# Patient Record
Sex: Male | Born: 1980 | Race: White | Hispanic: No | Marital: Single | Smoking: Never smoker
Health system: Southern US, Community
[De-identification: ages and names within clinical notes are randomized; demographics above are authoritative.]

## PROBLEM LIST (undated history)

## (undated) DIAGNOSIS — D171 Benign lipomatous neoplasm of skin and subcutaneous tissue of trunk: Secondary | ICD-10-CM

## (undated) DIAGNOSIS — Z86018 Personal history of other benign neoplasm: Secondary | ICD-10-CM

## (undated) DIAGNOSIS — N509 Disorder of male genital organs, unspecified: Principal | ICD-10-CM

## (undated) DIAGNOSIS — Z79899 Other long term (current) drug therapy: Secondary | ICD-10-CM

## (undated) DIAGNOSIS — M47816 Spondylosis without myelopathy or radiculopathy, lumbar region: Secondary | ICD-10-CM

## (undated) HISTORY — DX: Personal history of other benign neoplasm: Z86.018

## (undated) HISTORY — DX: Benign lipomatous neoplasm of skin and subcutaneous tissue of trunk: D17.1

## (undated) HISTORY — DX: Disorder of male genital organs, unspecified: N50.9

## (undated) HISTORY — DX: Other long term (current) drug therapy: Z79.899

## (undated) HISTORY — DX: Spondylosis without myelopathy or radiculopathy, lumbar region: M47.816

## (undated) HISTORY — PX: OTHER SURGICAL HISTORY: SHX169

---

## 2014-04-19 ENCOUNTER — Ambulatory Visit: Payer: Self-pay | Admitting: Internal Medicine

## 2014-10-02 IMAGING — CR DG LUMBAR SPINE COMPLETE 4+V
6 series · 6 of 6 positions shown · non-contrast
Comparison: None.

CLINICAL DATA: Chronic low back and sacral issues predominantly at
night with increased symptoms more recently

EXAM:
LUMBAR SPINE - COMPLETE 4+ VIEW

[l-spine ap]
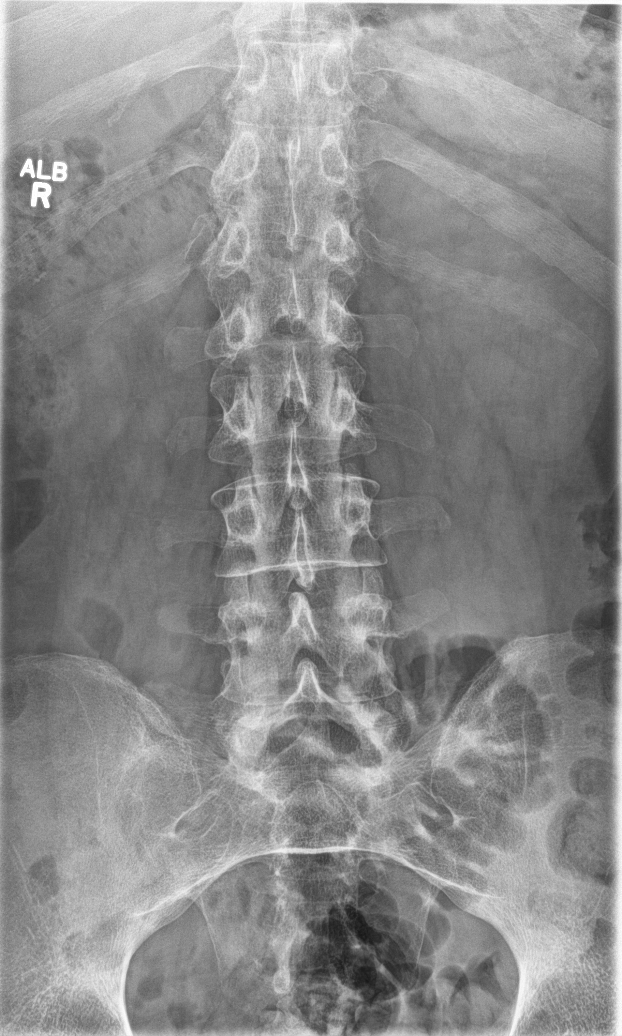

[l-spine obl (1 of 2)]
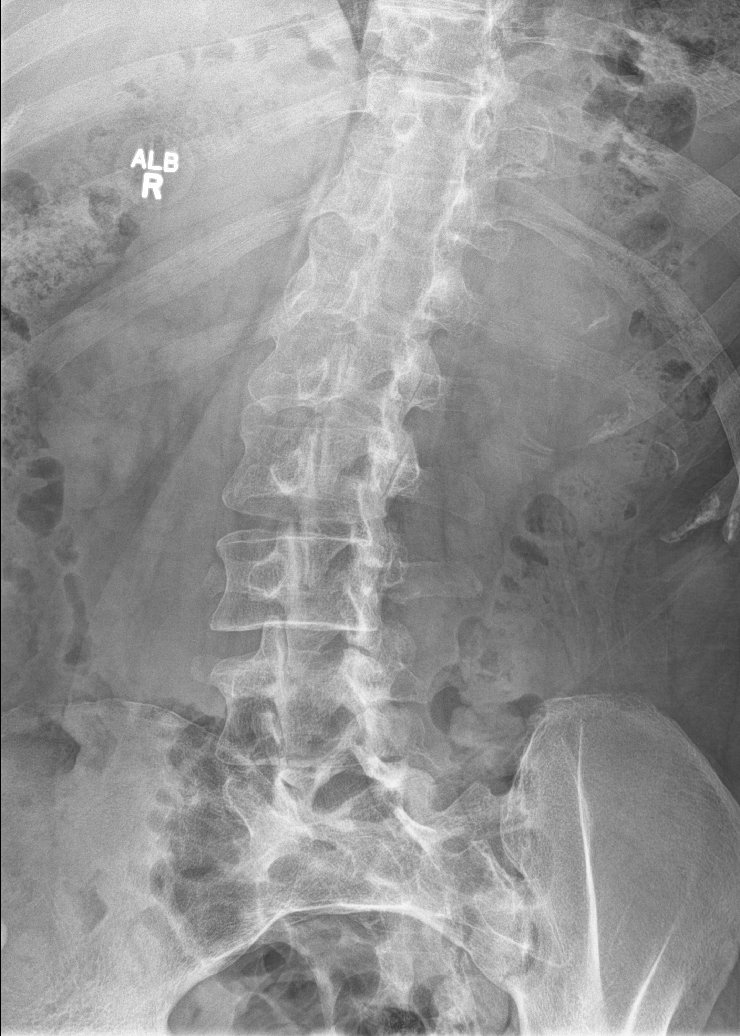

[l-spine obl (2 of 2)]
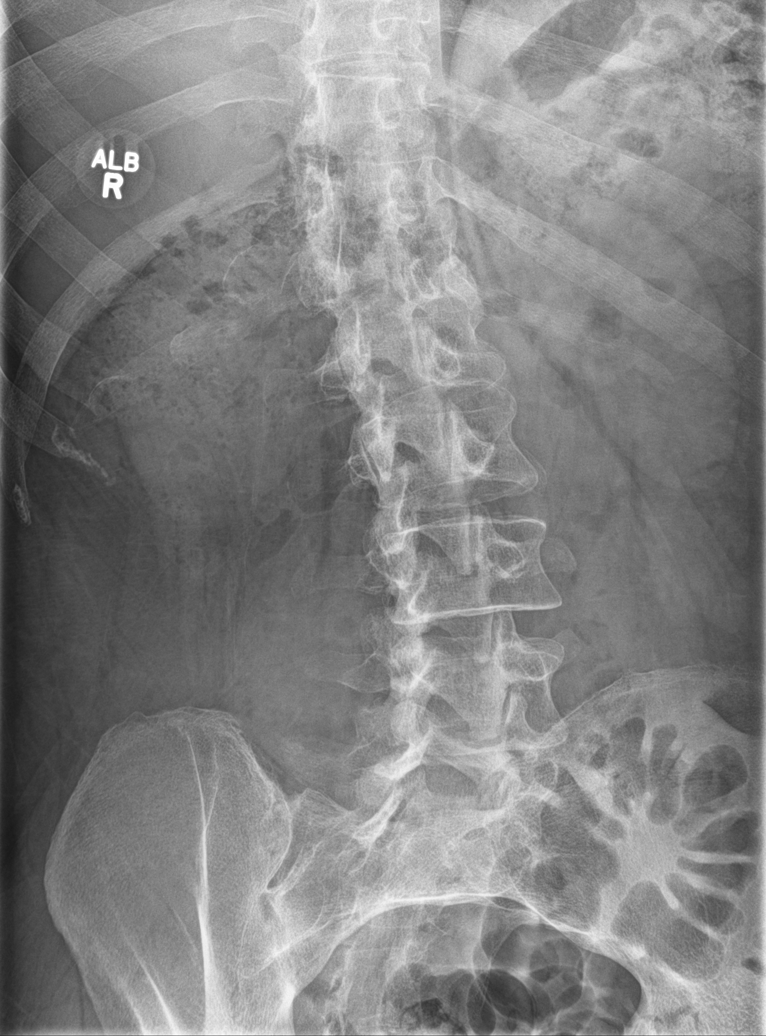

[l-spine lat]
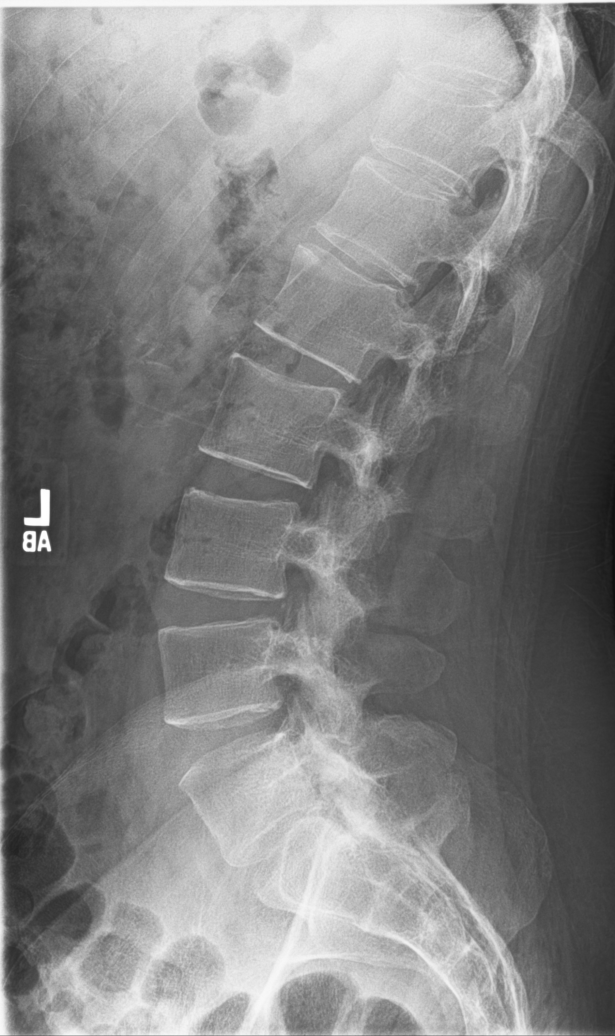

[l-spine spot (1 of 2)]
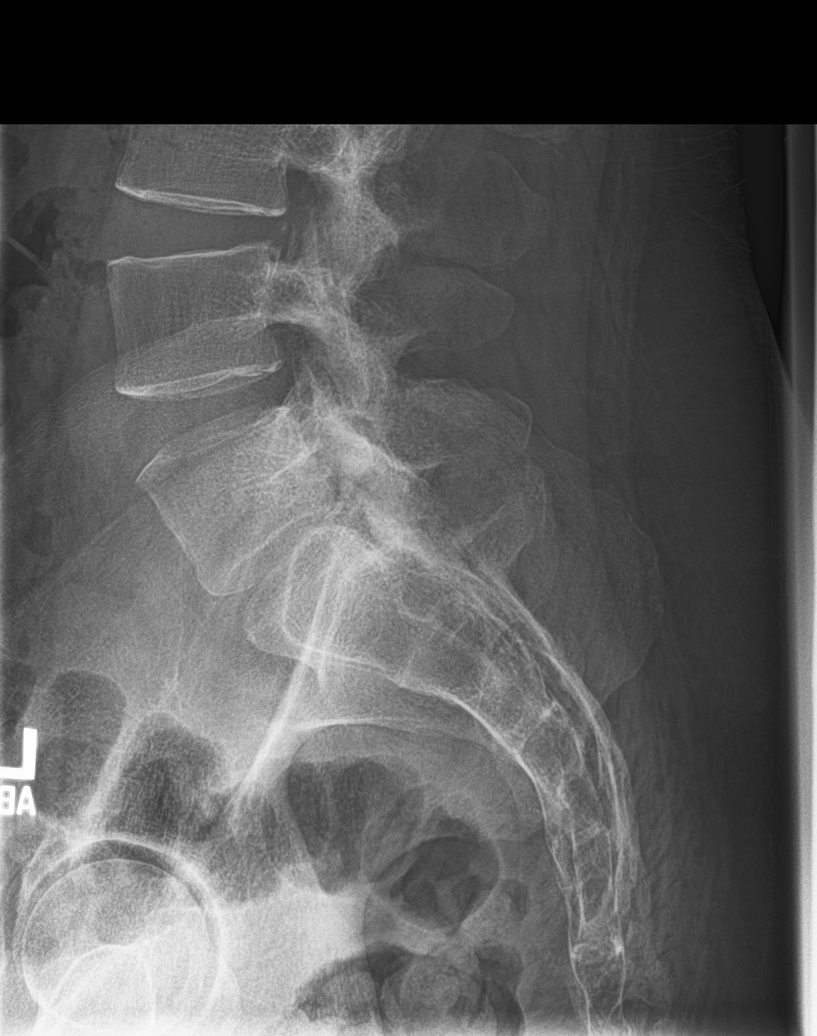

[l-spine spot (2 of 2)]
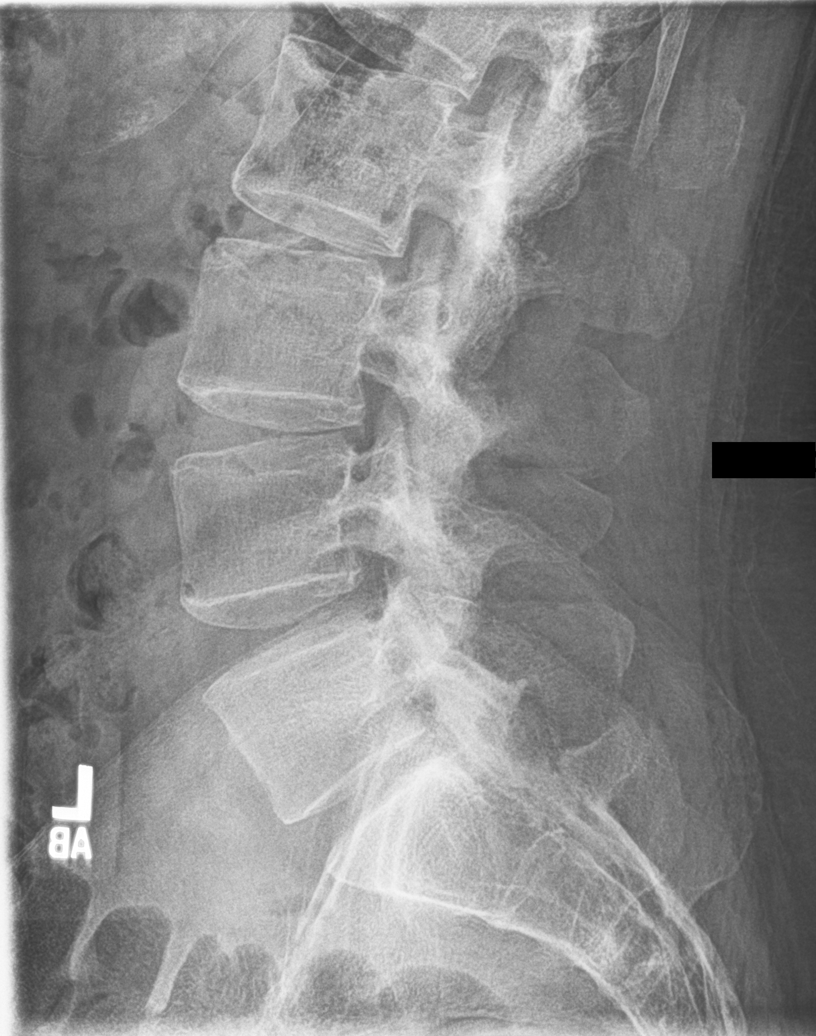

[6 of 6 positions shown; findings below may reference images not displayed]

FINDINGS: The lumbar vertebral bodies are preserved in height. The
intervertebral disc space heights are well maintained. There is mild
facet joint hypertrophy at L5-S1. The sacrum and SI joints are
grossly normal where visualized.
IMPRESSION: There is no acute or significant chronic bony abnormality of the
lumbar spine. Given the chronic nature of the symptoms, MRI may be
useful.

## 2014-11-17 LAB — BASIC METABOLIC PANEL
BUN: 16 mg/dL (ref 4–21)
Creatinine: 0.9 mg/dL (ref <=1.3)

## 2014-11-17 LAB — LIPID PANEL
Cholesterol: 168 mg/dL (ref 0–200)
HDL: 44 mg/dL (ref 35–70)
LDL Cholesterol: 113 mg/dL
Triglycerides: 57 mg/dL (ref 40–160)

## 2014-11-17 LAB — TSH: TSH: 1.08 u[IU]/mL (ref <=5.90)

## 2014-11-17 LAB — CBC AND DIFFERENTIAL: Hemoglobin: 14.7 g/dL (ref 13.5–17.5)

## 2015-07-03 ENCOUNTER — Encounter: Payer: Self-pay | Admitting: Internal Medicine

## 2015-07-03 ENCOUNTER — Other Ambulatory Visit: Payer: Self-pay | Admitting: Internal Medicine

## 2015-07-03 DIAGNOSIS — D171 Benign lipomatous neoplasm of skin and subcutaneous tissue of trunk: Secondary | ICD-10-CM

## 2015-07-03 DIAGNOSIS — M47816 Spondylosis without myelopathy or radiculopathy, lumbar region: Secondary | ICD-10-CM

## 2015-07-03 HISTORY — DX: Spondylosis without myelopathy or radiculopathy, lumbar region: M47.816

## 2015-07-03 HISTORY — DX: Benign lipomatous neoplasm of skin and subcutaneous tissue of trunk: D17.1

## 2015-12-07 ENCOUNTER — Other Ambulatory Visit: Payer: Self-pay | Admitting: Internal Medicine

## 2015-12-07 ENCOUNTER — Encounter: Payer: Self-pay | Admitting: Internal Medicine

## 2015-12-07 ENCOUNTER — Ambulatory Visit (INDEPENDENT_AMBULATORY_CARE_PROVIDER_SITE_OTHER): Payer: BLUE CROSS/BLUE SHIELD | Admitting: Internal Medicine

## 2015-12-07 VITALS — BP 118/78 | HR 78 | Temp 98.2°F | Resp 16 | Ht 72.0 in | Wt 265.0 lb

## 2015-12-07 DIAGNOSIS — Z872 Personal history of diseases of the skin and subcutaneous tissue: Secondary | ICD-10-CM | POA: Diagnosis not present

## 2015-12-07 DIAGNOSIS — Z0000 Encounter for general adult medical examination without abnormal findings: Secondary | ICD-10-CM

## 2015-12-07 DIAGNOSIS — Z79899 Other long term (current) drug therapy: Secondary | ICD-10-CM

## 2015-12-07 DIAGNOSIS — M47816 Spondylosis without myelopathy or radiculopathy, lumbar region: Secondary | ICD-10-CM

## 2015-12-07 DIAGNOSIS — M545 Low back pain: Secondary | ICD-10-CM | POA: Diagnosis not present

## 2015-12-07 DIAGNOSIS — Z86018 Personal history of other benign neoplasm: Secondary | ICD-10-CM

## 2015-12-07 HISTORY — DX: Other long term (current) drug therapy: Z79.899

## 2015-12-07 HISTORY — DX: Personal history of other benign neoplasm: Z86.018

## 2015-12-07 LAB — POCT URINALYSIS DIPSTICK
Bilirubin, UA: NEGATIVE
Blood, UA: NEGATIVE
Glucose, UA: NEGATIVE
Ketones, UA: NEGATIVE
Leukocytes, UA: NEGATIVE
Nitrite, UA: NEGATIVE
Protein, UA: NEGATIVE
Spec Grav, UA: <=1.005
Urobilinogen, UA: 0.2
pH, UA: 7

## 2015-12-07 NOTE — Patient Instructions (Signed)
DASH Eating Plan  DASH stands for "Dietary Approaches to Stop Hypertension." The DASH eating plan is a healthy eating plan that has been shown to reduce high blood pressure (hypertension). Additional health benefits may include reducing the risk of type 2 diabetes mellitus, heart disease, and stroke. The DASH eating plan may also help with weight loss.  WHAT DO I NEED TO KNOW ABOUT THE DASH EATING PLAN?  For the DASH eating plan, you will follow these general guidelines:  · Choose foods with a percent daily value for sodium of less than 5% (as listed on the food label).  · Use salt-free seasonings or herbs instead of table salt or sea salt.  · Check with your health care provider or pharmacist before using salt substitutes.  · Eat lower-sodium products, often labeled as "lower sodium" or "no salt added."  · Eat fresh foods.  · Eat more vegetables, fruits, and low-fat dairy products.  · Choose whole grains. Look for the word "whole" as the first word in the ingredient list.  · Choose fish and skinless chicken or turkey more often than red meat. Limit fish, poultry, and meat to 6 oz (170 g) each day.  · Limit sweets, desserts, sugars, and sugary drinks.  · Choose heart-healthy fats.  · Limit cheese to 1 oz (28 g) per day.  · Eat more home-cooked food and less restaurant, buffet, and fast food.  · Limit fried foods.  · Cook foods using methods other than frying.  · Limit canned vegetables. If you do use them, rinse them well to decrease the sodium.  · When eating at a restaurant, ask that your food be prepared with less salt, or no salt if possible.  WHAT FOODS CAN I EAT?  Seek help from a dietitian for individual calorie needs.  Grains  Whole grain or whole wheat bread. Brown rice. Whole grain or whole wheat pasta. Quinoa, bulgur, and whole grain cereals. Low-sodium cereals. Corn or whole wheat flour tortillas. Whole grain cornbread. Whole grain crackers. Low-sodium crackers.  Vegetables  Fresh or frozen vegetables  (raw, steamed, roasted, or grilled). Low-sodium or reduced-sodium tomato and vegetable juices. Low-sodium or reduced-sodium tomato sauce and paste. Low-sodium or reduced-sodium canned vegetables.   Fruits  All fresh, canned (in natural juice), or frozen fruits.  Meat and Other Protein Products  Ground beef (85% or leaner), grass-fed beef, or beef trimmed of fat. Skinless chicken or turkey. Ground chicken or turkey. Pork trimmed of fat. All fish and seafood. Eggs. Dried beans, peas, or lentils. Unsalted nuts and seeds. Unsalted canned beans.  Dairy  Low-fat dairy products, such as skim or 1% milk, 2% or reduced-fat cheeses, low-fat ricotta or cottage cheese, or plain low-fat yogurt. Low-sodium or reduced-sodium cheeses.  Fats and Oils  Tub margarines without trans fats. Light or reduced-fat mayonnaise and salad dressings (reduced sodium). Avocado. Safflower, olive, or canola oils. Natural peanut or almond butter.  Other  Unsalted popcorn and pretzels.  The items listed above may not be a complete list of recommended foods or beverages. Contact your dietitian for more options.  WHAT FOODS ARE NOT RECOMMENDED?  Grains  White bread. White pasta. White rice. Refined cornbread. Bagels and croissants. Crackers that contain trans fat.  Vegetables  Creamed or fried vegetables. Vegetables in a cheese sauce. Regular canned vegetables. Regular canned tomato sauce and paste. Regular tomato and vegetable juices.  Fruits  Dried fruits. Canned fruit in light or heavy syrup. Fruit juice.  Meat and Other Protein   Products  Fatty cuts of meat. Ribs, chicken wings, bacon, sausage, bologna, salami, chitterlings, fatback, hot dogs, bratwurst, and packaged luncheon meats. Salted nuts and seeds. Canned beans with salt.  Dairy  Whole or 2% milk, cream, half-and-half, and cream cheese. Whole-fat or sweetened yogurt. Full-fat cheeses or blue cheese. Nondairy creamers and whipped toppings. Processed cheese, cheese spreads, or cheese  curds.  Condiments  Onion and garlic salt, seasoned salt, table salt, and sea salt. Canned and packaged gravies. Worcestershire sauce. Tartar sauce. Barbecue sauce. Teriyaki sauce. Soy sauce, including reduced sodium. Steak sauce. Fish sauce. Oyster sauce. Cocktail sauce. Horseradish. Ketchup and mustard. Meat flavorings and tenderizers. Bouillon cubes. Hot sauce. Tabasco sauce. Marinades. Taco seasonings. Relishes.  Fats and Oils  Butter, stick margarine, lard, shortening, ghee, and bacon fat. Coconut, palm kernel, or palm oils. Regular salad dressings.  Other  Pickles and olives. Salted popcorn and pretzels.  The items listed above may not be a complete list of foods and beverages to avoid. Contact your dietitian for more information.  WHERE CAN I FIND MORE INFORMATION?  National Heart, Lung, and [employer] Institute: www.nhlbi.nih.gov/health/health-topics/topics/dash/     This information is not intended to replace advice given to you by your health care provider. Make sure you discuss any questions you have with your health care provider.     Document Released: 08/02/2011 Document Revised: 09/03/2014 Document Reviewed: 06/17/2013  Elsevier Interactive Patient Education ©2016 [employer]

## 2015-12-07 NOTE — Progress Notes (Signed)
Date:  12/07/2015   Name:  Max Russell   DOB:  [birth year]   MRN:  [id number]   Chief Complaint: Annual Exam Max Russell is a 36 y.o. male who presents today for his Complete Annual Exam. He feels fairly well. He reports exercising rarely. He reports he is sleeping fairly well.   Back ache - he does well during the day but take Aleve at night so he can sleep.  He denies weakness or numbness.  No loss of bowel or bladder function.  No side effects to Aleve.  Skin lesion - he has bump on the top of his head that appeared a few months ago. He thinks it is a cyst.  It is no painful or draining.  He sees Dermatology regularly since he has had several dysplastic nevi removed.  Review of Systems  Constitutional: Negative for fever, chills, fatigue and unexpected weight change.  HENT: Negative for hearing loss, sore throat and tinnitus.   Eyes: Negative for visual disturbance.  Respiratory: Negative for cough, chest tightness and wheezing.   Gastrointestinal: Negative for abdominal pain, diarrhea and constipation.  Endocrine: Negative for polydipsia and polyuria.  Genitourinary: Negative for frequency, hematuria, decreased urine volume and difficulty urinating.  Musculoskeletal: Positive for back pain. Negative for myalgias, joint swelling and gait problem.  Skin: Positive for color change. Negative for rash.  Allergic/Immunologic: Negative for environmental allergies and food allergies.  Neurological: Negative for dizziness, tremors, numbness and headaches.  Psychiatric/Behavioral: Negative for sleep disturbance and dysphoric mood.    Patient Active Problem List   Diagnosis Date Noted  . Abdominal lipoma 07/03/2015  . 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 Authorizing Provider  naproxen sodium (ALEVE) 220 MG tablet Take 1 tablet by mouth 2 (two) times daily as needed.   Yes Historical Provider, MD      No Known Allergies  History reviewed. No pertinent past surgical history.  Social History  Substance Use Topics  . Smoking status: Never Smoker   . Smokeless tobacco: None  . Alcohol Use: 2.4 oz/week    4 Standard drinks or equivalent per week   Lab Results  Component Value Date   CHOL 168 11/17/2014   HDL 44 11/17/2014   LDLCALC 113 11/17/2014   TRIG 57 11/17/2014    Medication list has been reviewed and updated.   Physical Exam  Constitutional: He is oriented to person, place, and time. He appears well-developed and well-nourished.  HENT:  Head: Normocephalic.  Right Ear: Tympanic membrane, external ear and ear canal normal.  Left Ear: Tympanic membrane, external ear and ear canal normal.  Nose: Nose normal.  Mouth/Throat: Uvula is midline and oropharynx is clear and moist.  Eyes: Conjunctivae and EOM are normal. Pupils are equal, round, and reactive to light.  Neck: Normal range of motion. Neck supple. Carotid bruit is not present. No thyromegaly present.  Cardiovascular: Normal rate, regular rhythm, normal heart sounds and intact distal pulses.   Pulmonary/Chest: Effort normal and breath sounds normal. He has no wheezes. Right breast exhibits no mass. Left breast exhibits no mass.  Abdominal: Soft. Normal appearance and bowel sounds are normal. There is no hepatosplenomegaly. There is no tenderness.  Musculoskeletal: He exhibits no edema.       Lumbar back: He exhibits decreased range of motion and tenderness. He exhibits no spasm.  Lymphadenopathy:    He has no cervical adenopathy.  Neurological: He is alert and oriented to person, place, and time. He has normal strength and normal reflexes. Coordination and gait normal.  Skin: Skin is warm, dry and intact.  Tiny 1 mm crusty lesion on top of head - surrounding skin normal.  Psychiatric: He has a normal mood and affect. His speech is normal and behavior is normal. Judgment and thought content normal.  Nursing note  and vitals reviewed.   BP 118/78 mmHg  Pulse 78  Temp(Src) 98.2 F (36.8 C)  Resp 16  Ht 6' (1.829 m)  Wt 265 lb (120.203 kg)  BMI 35.93 kg/m2  SpO2 100%  Assessment and Plan: 1. Annual physical exam Recommend regular exercise Healthy diet discussed Lipids last year were normal - POCT urinalysis dipstick  2. Facet syndrome, lumbar Stable; continue Aleve as needed  3. Long term use of drug - CBC with Differential/Platelet - Comprehensive metabolic panel  4. Hx of dysplastic nevus Follow up with Dermatology Lesion on scalp appears benign   Halina Maidens, MD [facility]  12/07/2015

## 2015-12-08 LAB — CBC WITH DIFFERENTIAL/PLATELET
Basophils Absolute: 0 10*3/uL (ref 0.0–0.2)
Basos: 0 %
EOS (ABSOLUTE): 0.2 10*3/uL (ref 0.0–0.4)
Eos: 2 %
Hematocrit: 41.4 % (ref 37.5–51.0)
Hemoglobin: 14 g/dL (ref 12.6–17.7)
Immature Grans (Abs): 0 10*3/uL (ref 0.0–0.1)
Immature Granulocytes: 0 %
Lymphocytes Absolute: 2.5 10*3/uL (ref 0.7–3.1)
Lymphs: 28 %
MCH: 27.9 pg (ref 26.6–33.0)
MCHC: 33.8 g/dL (ref 31.5–35.7)
MCV: 83 fL (ref 79–97)
Monocytes Absolute: 0.6 10*3/uL (ref 0.1–0.9)
Monocytes: 7 %
Neutrophils Absolute: 5.5 10*3/uL (ref 1.4–7.0)
Neutrophils: 63 %
Platelets: 318 10*3/uL (ref 150–379)
RBC: 5.01 x10E6/uL (ref 4.14–5.80)
RDW: 13.7 % (ref 12.3–15.4)
WBC: 8.8 10*3/uL (ref 3.4–10.8)

## 2015-12-08 LAB — COMPREHENSIVE METABOLIC PANEL
ALT: 21 IU/L (ref 0–44)
AST: 23 IU/L (ref 0–40)
Albumin/Globulin Ratio: 1.6 (ref 1.2–2.2)
Albumin: 4.4 g/dL (ref 3.5–5.5)
Alkaline Phosphatase: 61 IU/L (ref 39–117)
BUN/Creatinine Ratio: 16 (ref 9–20)
BUN: 13 mg/dL (ref 6–20)
Bilirubin Total: 0.5 mg/dL (ref 0.0–1.2)
CO2: 26 mmol/L (ref 18–29)
Calcium: 9.5 mg/dL (ref 8.7–10.2)
Chloride: 97 mmol/L (ref 96–106)
Creatinine, Ser: 0.82 mg/dL (ref 0.76–1.27)
GFR calc Af Amer: 132 mL/min/{1.73_m2} (ref >59)
GFR calc non Af Amer: 114 mL/min/{1.73_m2} (ref >59)
Globulin, Total: 2.8 g/dL (ref 1.5–4.5)
Glucose: 84 mg/dL (ref 65–99)
Potassium: 4.6 mmol/L (ref 3.5–5.2)
Sodium: 139 mmol/L (ref 134–144)
Total Protein: 7.2 g/dL (ref 6.0–8.5)

## 2015-12-13 ENCOUNTER — Telehealth: Payer: Self-pay

## 2015-12-13 NOTE — Telephone Encounter (Signed)
Spoke with patient. Patient advised of all results and verbalized understanding. Will call back with any future questions or concerns. MAH  

## 2015-12-13 NOTE — Telephone Encounter (Signed)
-----   Message from Glean Hess, MD sent at 12/09/2015  5:42 PM EDT ----- Labs are all normal.

## 2016-03-12 DIAGNOSIS — Z872 Personal history of diseases of the skin and subcutaneous tissue: Secondary | ICD-10-CM | POA: Diagnosis not present

## 2016-03-12 DIAGNOSIS — Z1283 Encounter for screening for malignant neoplasm of skin: Secondary | ICD-10-CM | POA: Diagnosis not present

## 2016-06-04 ENCOUNTER — Ambulatory Visit (INDEPENDENT_AMBULATORY_CARE_PROVIDER_SITE_OTHER): Payer: BLUE CROSS/BLUE SHIELD | Admitting: Internal Medicine

## 2016-06-04 ENCOUNTER — Encounter: Payer: Self-pay | Admitting: Internal Medicine

## 2016-06-04 VITALS — BP 118/82 | HR 97 | Resp 16 | Ht 72.0 in | Wt 269.0 lb

## 2016-06-04 DIAGNOSIS — N509 Disorder of male genital organs, unspecified: Secondary | ICD-10-CM

## 2016-06-04 HISTORY — DX: Disorder of male genital organs, unspecified: N50.9

## 2016-06-04 NOTE — Progress Notes (Signed)
    Date:  06/04/2016   Name:  Max Russell   DOB:  02-Apr-1981   MRN:  [id number]   Chief Complaint: Groin Swelling (Testicle not sitting right ) He has a remote hx of spermatocele untreated.  Over the past few weeks he believes that his left testicle is sitting sideways in his scrotal sac.  He denies pain, swelling, redness or injury.     Review of Systems  Constitutional: Negative for chills and fever.  Genitourinary: Negative for difficulty urinating, dysuria, genital sores, penile swelling, scrotal swelling, testicular pain and urgency.    Patient Active Problem List   Diagnosis Date Noted  . Hx of dysplastic nevus 12/07/2015  . Long term use of drug 12/07/2015  . Abdominal lipoma 07/03/2015  . 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 Authorizing Provider  naproxen sodium (ALEVE) 220 MG tablet Take 1 tablet by mouth 2 (two) times daily as needed.   Yes Historical Provider, MD  Omega-3 Fatty Acids (FISH OIL) 1000 MG CAPS Take 1 capsule by mouth.   Yes Historical Provider, MD    No Known Allergies  Past Surgical History:  Procedure Laterality Date  . dyplastic nevus     x 4    Social History  Substance Use Topics  . Smoking status: Never Smoker  . Smokeless tobacco: Never Used  . Alcohol use 2.4 oz/week    4 Standard drinks or equivalent per week     Medication list has been reviewed and updated.   Physical Exam  Constitutional: He is oriented to person, place, and time. He appears well-developed. No distress.  HENT:  Head: Normocephalic and atraumatic.  Pulmonary/Chest: Effort normal. No respiratory distress.  Abdominal: Hernia confirmed negative in the right inguinal area and confirmed negative in the left inguinal area.  Genitourinary: Penis normal. Right testis shows no mass, no swelling and no tenderness. Left testis shows mass (.5 cm soft mass superior left testicle). Left  testis shows no swelling and no tenderness. Circumcised. No discharge found.  Musculoskeletal: Normal range of motion.  Lymphadenopathy:       Right: No inguinal adenopathy present.       Left: No inguinal adenopathy present.  Neurological: He is alert and oriented to person, place, and time.  Skin: Skin is warm and dry. No rash noted.  Psychiatric: He has a normal mood and affect. His behavior is normal. Thought content normal.  Nursing note and vitals reviewed.   BP 118/82   Pulse 97   Resp 16   Ht 6' (1.829 m)   Wt 269 lb (122 kg)   SpO2 100%   BMI 36.48 kg/m   Assessment and Plan: 1. Testicular abnormality Refer to Urology for further evaluation - Ambulatory referral to Urology   Halina Maidens, MD [facility]  06/04/2016

## 2016-06-14 ENCOUNTER — Ambulatory Visit (INDEPENDENT_AMBULATORY_CARE_PROVIDER_SITE_OTHER): Payer: BLUE CROSS/BLUE SHIELD | Admitting: Urology

## 2016-06-14 ENCOUNTER — Encounter: Payer: Self-pay | Admitting: Urology

## 2016-06-14 VITALS — BP 133/74 | HR 91 | Ht 72.0 in | Wt 271.0 lb

## 2016-06-14 DIAGNOSIS — N509 Disorder of male genital organs, unspecified: Secondary | ICD-10-CM | POA: Diagnosis not present

## 2016-06-14 NOTE — Progress Notes (Signed)
06/14/2016 2:50 PM   Max Russell 08-25-1981 [id number]  Referring provider: Glean Hess, MD [address] [place], [place] [address]  Chief Complaint  Patient presents with  . Testicular Mass    New Patient    HPI: 35 year old male who presents today for evaluation of a scrotal abnormality.  He reports that over the past 3-4 weeks, he is noted that his left testicle has been lying abnormally in his scrotal sac in an horizontal fashion. He denies any pain or discomfort from this whatsoever. This was incidentally noted on self-exam in the shower.  He does have a known history of a small spermatocele and was seen in the remote past, at least 10 years ago for this. This has remained stable and never had surgical intervention.  No family history of testicular torsion or testicular cancer.  He denies urinary symptoms today.    PMH: Past Medical History:  Diagnosis Date  . Abdominal lipoma 07/03/2015  . 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 Hx of dysplastic nevus 12/07/2015  . Long term use of drug 12/07/2015  . 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    Surgical History: Past Surgical History:  Procedure Laterality Date  . dyplastic nevus     x 4    Home Medications:    Medication List       Accurate as of 06/14/16  2:50 PM. Always use your most recent med list.          ALEVE 220 MG tablet Generic drug:  naproxen sodium Take 1 tablet by mouth 2 (two) times daily as needed.   Fish Oil 1000 MG Caps Take 1 capsule by mouth.       Allergies: No Known Allergies  Family History: Family History  Problem Relation Age of Onset  . Hyperlipidemia Mother   . Hypertension Mother   . Gallstones Mother   . Heart disease Mother     CAD  . Cancer Father     prostate cancer  . Kidney cancer Neg Hx     Social History:  reports that he has never smoked. He has never used  smokeless tobacco. He reports that he drinks about 2.4 oz of alcohol per week . He reports that he does not use drugs.  ROS: UROLOGY Frequent Urination?: No Hard to postpone urination?: No Burning/pain with urination?: No Get up at night to urinate?: No Leakage of urine?: No Urine stream starts and stops?: No Trouble starting stream?: No Do you have to strain to urinate?: No Blood in urine?: No Urinary tract infection?: No Sexually transmitted disease?: No Injury to kidneys or bladder?: No Painful intercourse?: No Weak stream?: No Erection problems?: No Penile pain?: No  Gastrointestinal Nausea?: No Vomiting?: No Indigestion/heartburn?: No Diarrhea?: No Constipation?: No  Constitutional Fever: No Night sweats?: No Weight loss?: No Fatigue?: No  Skin Skin rash/lesions?: No Itching?: No  Eyes Blurred vision?: No Double vision?: No  Ears/Nose/Throat Sore throat?: No Sinus problems?: No  Hematologic/Lymphatic Swollen glands?: No Easy bruising?: No  Cardiovascular Leg swelling?: No Chest pain?: No  Respiratory Cough?: No Shortness of breath?: No  Endocrine Excessive thirst?: No  Musculoskeletal Back pain?: Yes Joint pain?: No  Neurological Headaches?: No Dizziness?: No  Psychologic Depression?: No Anxiety?: No  Physical Exam: BP 133/74   Pulse 91   Ht 6' (1.829 m)   Wt 271 lb (122.9 kg)  BMI 36.75 kg/m   Constitutional:  Alert and oriented, No acute distress. HEENT: [place] AT, moist mucus membranes.  Trachea midline, no masses. Cardiovascular: No clubbing, cyanosis, or edema. Respiratory: Normal respiratory effort, no increased work of breathing. GI: Abdomen is soft, nontender, nondistended, no abdominal masses.  Prominent suprapubic fat pad. No inguinal hernias appreciated. GU: Circumcised phallus with orthotopic meatus. Testicles descended bilaterally. Somewhat tight scrotal sac and difficult exam today. Testicles normal bilaterally  although left testicle may indeed have a slight aberration and lie, approximately 30 from vertical. No obvious extra testicular tumors. Slightly prominent left epididymis with possible left spermatocele. No hydrocele. No scrotal skin changes. Skin: No rashes, bruises or suspicious lesions. Lymph: No cervical or inguinal adenopathy. Neurologic: Grossly intact, no focal deficits, moving all 4 extremities. Psychiatric: Normal mood and affect.  Laboratory Data: Lab Results  Component Value Date   WBC 8.8 12/07/2015   HGB 14.7 11/17/2014   HCT 41.4 12/07/2015   MCV 83 12/07/2015   PLT 318 12/07/2015    Lab Results  Component Value Date   CREATININE 0.82 12/07/2015     Pertinent Imaging: No recent scrotal imaging  Assessment & Plan:   1. Testicular abnormality Although there may be a slight deviation the axis of the left testicle, there is no testicular tenderness, masses, or any other concerning findings.  Although alteration in testicular axis does bring to remind concern for intermittent torsion, although he has absolutely no signs or symptoms of this including no testicular pain or discomfort. As such, I feel that this is unlikely.  We carefully reviewed signs and symptoms of testicular torsion. We did briefly discuss orchidopexy along with the risks and benefits and the role in preventing future episodes of torsion. At this point, I do not feel that this is indicated given his absence of symptoms. I do not feel that further scrotal imaging would be helpful.  He was advised to return if he develops any recurrent episodes of testicular pain or to the emergency room if the pain last more than 10 min.  he is agreeable with this plan.   Return if symptoms worsen or fail to improve.  Hollice Espy, MD  [facility] [address], [place], [place] [address] [phone]

## 2016-12-12 ENCOUNTER — Ambulatory Visit (INDEPENDENT_AMBULATORY_CARE_PROVIDER_SITE_OTHER): Payer: BLUE CROSS/BLUE SHIELD | Admitting: Internal Medicine

## 2016-12-12 ENCOUNTER — Encounter: Payer: Self-pay | Admitting: Internal Medicine

## 2016-12-12 VITALS — BP 116/78 | HR 84 | Ht 72.0 in | Wt 266.2 lb

## 2016-12-12 DIAGNOSIS — M47816 Spondylosis without myelopathy or radiculopathy, lumbar region: Secondary | ICD-10-CM

## 2016-12-12 DIAGNOSIS — Z79899 Other long term (current) drug therapy: Secondary | ICD-10-CM | POA: Diagnosis not present

## 2016-12-12 DIAGNOSIS — G5622 Lesion of ulnar nerve, left upper limb: Secondary | ICD-10-CM | POA: Diagnosis not present

## 2016-12-12 DIAGNOSIS — R319 Hematuria, unspecified: Secondary | ICD-10-CM | POA: Diagnosis not present

## 2016-12-12 DIAGNOSIS — Z0000 Encounter for general adult medical examination without abnormal findings: Secondary | ICD-10-CM

## 2016-12-12 DIAGNOSIS — M4696 Unspecified inflammatory spondylopathy, lumbar region: Secondary | ICD-10-CM | POA: Diagnosis not present

## 2016-12-12 LAB — POC URINALYSIS WITH MICROSCOPIC (NON AUTO)MANUAL RESULT
Bacteria, UA: 0
Bilirubin, UA: NEGATIVE
Crystals: 0
Epithelial cells, urine per micros: 0
Glucose, UA: NEGATIVE
Ketones, UA: NEGATIVE
Leukocytes, UA: NEGATIVE
Mucus, UA: 0
Nitrite, UA: NEGATIVE
Protein, UA: NEGATIVE
RBC: 5 M/uL (ref 4.69–6.13)
Spec Grav, UA: 1.01 (ref 1.010–1.025)
Urobilinogen, UA: 0.2 E.U./dL
WBC Casts, UA: 0
pH, UA: 6.5 (ref 5.0–8.0)

## 2016-12-12 MED ORDER — CIPROFLOXACIN HCL 250 MG PO TABS: 250.0000 mg | ORAL_TABLET | Freq: Two times a day (BID) | ORAL | 0 refills | Status: AC

## 2016-12-12 NOTE — Progress Notes (Signed)
Date:  12/12/2016   Name:  Max Russell   DOB:  1981/02/21   MRN:  [id number]   Chief Complaint: Annual Exam Max Russell is a 36 y.o. male who presents today for his Complete Annual Exam. He feels fairly well. His job is very stressful - working 10-12 hr/day. He reports exercising none. He reports he is sleeping fairly well.   Back Pain  This is a recurrent problem. The problem occurs daily. The problem is unchanged. The pain is present in the lumbar spine. The pain does not radiate. The pain is mild. Pertinent negatives include no abdominal pain, chest pain, dysuria or headaches. He has tried NSAIDs (takes aleve daily) for the symptoms. The treatment provided significant relief.   Tingling - in fingers of left hand (4 and 5) in the morning.  It goes away quickly and does not occur during the day.  He works at [employer] all day.  Does not do any weight lifting.  No weakness is noted.  Review of Systems  Constitutional: Negative for appetite change, chills, diaphoresis, fatigue and unexpected weight change.  HENT: Negative for hearing loss, tinnitus, trouble swallowing and voice change.   Eyes: Negative for visual disturbance.  Respiratory: Negative for choking, shortness of breath and wheezing.   Cardiovascular: Negative for chest pain, palpitations and leg swelling.  Gastrointestinal: Negative for abdominal pain, blood in stool, constipation and diarrhea.  Genitourinary: Negative for difficulty urinating, dysuria and frequency.  Musculoskeletal: Positive for back pain and myalgias (tingling in left 4 and 5 fingers in the AM). Negative for arthralgias.  Skin: Negative for color change and rash.  Neurological: Negative for dizziness, syncope and headaches.  Hematological: Negative for adenopathy.  Psychiatric/Behavioral: Negative for dysphoric mood and sleep disturbance.    Patient Active Problem List   Diagnosis Date Noted  . Testicular abnormality 06/04/2016  . Hx of  dysplastic nevus 12/07/2015  . Long term use of drug 12/07/2015  . Abdominal lipoma 07/03/2015  . Facet syndrome, lumbar ([place]) 07/03/2015    Prior to Admission medications   Medication Sig Start Date End Date Taking? Authorizing Provider  naproxen sodium (ALEVE) 220 MG tablet Take 1 tablet by mouth 2 (two) times daily as needed.    Historical Provider, MD  Omega-3 Fatty Acids (FISH OIL) 1000 MG CAPS Take 1 capsule by mouth.    Historical Provider, MD    No Known Allergies  Past Surgical History:  Procedure Laterality Date  . dyplastic nevus     x 4    Social History  Substance Use Topics  . Smoking status: Never Smoker  . Smokeless tobacco: Never Used  . Alcohol use 2.4 oz/week    4 Standard drinks or equivalent per week     Medication list has been reviewed and updated.   Physical Exam  Constitutional: He is oriented to person, place, and time. He appears well-developed and well-nourished.  HENT:  Head: Normocephalic.  Right Ear: Tympanic membrane, external ear and ear canal normal.  Left Ear: Tympanic membrane, external ear and ear canal normal.  Nose: Nose normal.  Mouth/Throat: Uvula is midline and oropharynx is clear and moist.  Eyes: Conjunctivae and EOM are normal. Pupils are equal, round, and reactive to light.  Neck: Normal range of motion. Neck supple. Carotid bruit is not present. No thyromegaly present.  Cardiovascular: Normal rate, regular rhythm, normal heart sounds and intact distal pulses.   Pulmonary/Chest: Effort normal and breath sounds normal. He has no  wheezes. Right breast exhibits no mass. Left breast exhibits no mass.  Abdominal: Soft. Normal appearance and bowel sounds are normal. There is no hepatosplenomegaly. There is no tenderness.  Musculoskeletal: Normal range of motion.       Right elbow: Normal.      Left elbow: Normal.       Right wrist: Normal.       Left wrist: Normal.  Lymphadenopathy:    He has no cervical adenopathy.    Neurological: He is alert and oriented to person, place, and time. He has normal reflexes.  Skin: Skin is warm, dry and intact.  Psychiatric: He has a normal mood and affect. His speech is normal and behavior is normal. Judgment and thought content normal.  Nursing note and vitals reviewed.  Urine dipstick shows negative for all components, positive for red blood cells.  BP 116/78 (BP Location: Right Arm, Patient Position: Sitting, Cuff Size: Large)   Pulse 84   Ht 6' (1.829 m)   Wt 266 lb 3.2 oz (120.7 kg)   SpO2 100%   BMI 36.10 kg/m   Assessment and Plan: 1. Annual physical exam Recommend regular exercise and healthy diet - Lipid panel - POCT urinalysis dipstick  2. Long term use of drug nsaids for back pain - CBC with Differential/Platelet - Comprehensive metabolic panel  3. Facet syndrome, lumbar ([facility]) Continue Aleve daily  4. Ulnar tunnel syndrome of left wrist No treatment needed at this time since sx are transient.   5. Hematuria, unspecified type Uncertain cause - will treat for occult infection then recheck in 2 weeks.   No orders of the defined types were placed in this encounter.   Halina Maidens, MD [facility]  12/12/2016

## 2016-12-12 NOTE — Patient Instructions (Signed)
 Health Maintenance, Male A healthy lifestyle and preventive care is important for your health and wellness. Ask your health care provider about what schedule of regular examinations is right for you. What should I know about weight and diet?  Eat a Healthy Diet  Eat plenty of vegetables, fruits, whole grains, low-fat dairy products, and lean protein.  Do not eat a lot of foods high in solid fats, added sugars, or salt. Maintain a Healthy Weight  Regular exercise can help you achieve or maintain a healthy weight. You should:  Do at least 150 minutes of exercise each week. The exercise should increase your heart rate and make you sweat (moderate-intensity exercise).  Do strength-training exercises at least twice a week. Watch Your Levels of Cholesterol and Blood Lipids  Have your blood tested for lipids and cholesterol every 5 years starting at 38 years of age. If you are at high risk for heart disease, you should start having your blood tested when you are 38 years old. You may need to have your cholesterol levels checked more often if:  Your lipid or cholesterol levels are high.  You are older than 38 years of age.  You are at high risk for heart disease. What should I know about cancer screening? Many types of cancers can be detected early and may often be prevented. Lung Cancer  You should be screened every year for lung cancer if:  You are a current smoker who has smoked for at least 30 years.  You are a former smoker who has quit within the past 15 years.  Talk to your health care provider about your screening options, when you should start screening, and how often you should be screened. Colorectal Cancer  Routine colorectal cancer screening usually begins at 38 years of age and should be repeated every 5-10 years until you are 38 years old. You may need to be screened more often if early forms of precancerous polyps or small growths are found. Your health care provider  may recommend screening at an earlier age if you have risk factors for colon cancer.  Your health care provider may recommend using home test kits to check for hidden blood in the stool.  A small camera at the end of a tube can be used to examine your colon (sigmoidoscopy or colonoscopy). This checks for the earliest forms of colorectal cancer. Prostate and Testicular Cancer  Depending on your age and overall health, your health care provider may do certain tests to screen for prostate and testicular cancer.  Talk to your health care provider about any symptoms or concerns you have about testicular or prostate cancer. Skin Cancer  Check your skin from head to toe regularly.  Tell your health care provider about any new moles or changes in moles, especially if:  There is a change in a mole's size, shape, or color.  You have a mole that is larger than a pencil eraser.  Always use sunscreen. Apply sunscreen liberally and repeat throughout the day.  Protect yourself by wearing long sleeves, pants, a wide-brimmed hat, and sunglasses when outside. What should I know about heart disease, diabetes, and high blood pressure?  If you are 18-39 years of age, have your blood pressure checked every 3-5 years. If you are 40 years of age or older, have your blood pressure checked every year. You should have your blood pressure measured twice-once when you are at a hospital or clinic, and once when you are not at   a hospital or clinic. Record the average of the two measurements. To check your blood pressure when you are not at a hospital or clinic, you can use:  An automated blood pressure machine at a pharmacy.  A home blood pressure monitor.  Talk to your health care provider about your target blood pressure.  If you are between 45-79 years old, ask your health care provider if you should take aspirin to prevent heart disease.  Have regular diabetes screenings by checking your fasting blood sugar  level.  If you are at a normal weight and have a low risk for diabetes, have this test once every three years after the age of 45.  If you are overweight and have a high risk for diabetes, consider being tested at a younger age or more often.  A one-time screening for abdominal aortic aneurysm (AAA) by ultrasound is recommended for men aged 65-75 years who are current or former smokers. What should I know about preventing infection? Hepatitis B  If you have a higher risk for hepatitis B, you should be screened for this virus. Talk with your health care provider to find out if you are at risk for hepatitis B infection. Hepatitis C  Blood testing is recommended for:  Everyone born from 1945 through 1965.  Anyone with known risk factors for hepatitis C. Sexually Transmitted Diseases (STDs)  You should be screened each year for STDs including gonorrhea and chlamydia if:  You are sexually active and are younger than 38 years of age.  You are older than 38 years of age and your health care provider tells you that you are at risk for this type of infection.  Your sexual activity has changed since you were last screened and you are at an increased risk for chlamydia or gonorrhea. Ask your health care provider if you are at risk.  Talk with your health care provider about whether you are at high risk of being infected with HIV. Your health care provider may recommend a prescription medicine to help prevent HIV infection. What else can I do?  Schedule regular health, dental, and eye exams.  Stay current with your vaccines (immunizations).  Do not use any tobacco products, such as cigarettes, chewing tobacco, and e-cigarettes. If you need help quitting, ask your health care provider.  Limit alcohol intake to no more than 2 drinks per day. One drink equals 12 ounces of beer, 5 ounces of wine, or 1 ounces of hard liquor.  Do not use street drugs.  Do not share needles.  Ask your health  care provider for help if you need support or information about quitting drugs.  Tell your health care provider if you often feel depressed.  Tell your health care provider if you have ever been abused or do not feel safe at home. This information is not intended to replace advice given to you by your health care provider. Make sure you discuss any questions you have with your health care provider. Document Released: 02/09/2008 Document Revised: 04/11/2016 Document Reviewed: 05/17/2015 Elsevier Interactive Patient Education  2017 [employer].  

## 2016-12-13 LAB — LIPID PANEL
Chol/HDL Ratio: 4.4 ratio (ref 0.0–5.0)
Cholesterol, Total: 168 mg/dL (ref 100–199)
HDL: 38 mg/dL — ABNORMAL LOW (ref >39)
LDL Calculated: 117 mg/dL — ABNORMAL HIGH (ref 0–99)
Triglycerides: 67 mg/dL (ref 0–149)
VLDL Cholesterol Cal: 13 mg/dL (ref 5–40)

## 2016-12-13 LAB — CBC WITH DIFFERENTIAL/PLATELET
Basophils Absolute: 0 10*3/uL (ref 0.0–0.2)
Basos: 1 %
EOS (ABSOLUTE): 0.2 10*3/uL (ref 0.0–0.4)
Eos: 2 %
Hematocrit: 43.2 % (ref 37.5–51.0)
Hemoglobin: 14.1 g/dL (ref 13.0–17.7)
Immature Grans (Abs): 0 10*3/uL (ref 0.0–0.1)
Immature Granulocytes: 0 %
Lymphocytes Absolute: 2.4 10*3/uL (ref 0.7–3.1)
Lymphs: 29 %
MCH: 27.8 pg (ref 26.6–33.0)
MCHC: 32.6 g/dL (ref 31.5–35.7)
MCV: 85 fL (ref 79–97)
Monocytes Absolute: 0.6 10*3/uL (ref 0.1–0.9)
Monocytes: 7 %
Neutrophils Absolute: 5.1 10*3/uL (ref 1.4–7.0)
Neutrophils: 61 %
Platelets: 319 10*3/uL (ref 150–379)
RBC: 5.08 x10E6/uL (ref 4.14–5.80)
RDW: 13.5 % (ref 12.3–15.4)
WBC: 8.3 10*3/uL (ref 3.4–10.8)

## 2016-12-13 LAB — COMPREHENSIVE METABOLIC PANEL
ALT: 26 IU/L (ref 0–44)
AST: 22 IU/L (ref 0–40)
Albumin/Globulin Ratio: 1.6 (ref 1.2–2.2)
Albumin: 4.5 g/dL (ref 3.5–5.5)
Alkaline Phosphatase: 55 IU/L (ref 39–117)
BUN/Creatinine Ratio: 15 (ref 9–20)
BUN: 14 mg/dL (ref 6–20)
Bilirubin Total: 0.5 mg/dL (ref 0.0–1.2)
CO2: 27 mmol/L (ref 18–29)
Calcium: 9.3 mg/dL (ref 8.7–10.2)
Chloride: 98 mmol/L (ref 96–106)
Creatinine, Ser: 0.93 mg/dL (ref 0.76–1.27)
GFR calc Af Amer: 121 mL/min/{1.73_m2} (ref >59)
GFR calc non Af Amer: 105 mL/min/{1.73_m2} (ref >59)
Globulin, Total: 2.8 g/dL (ref 1.5–4.5)
Glucose: 88 mg/dL (ref 65–99)
Potassium: 4.6 mmol/L (ref 3.5–5.2)
Sodium: 140 mmol/L (ref 134–144)
Total Protein: 7.3 g/dL (ref 6.0–8.5)

## 2016-12-13 NOTE — Progress Notes (Signed)
Spoke to pt about normal lab results. Pt said that he wants hold on taking his pain medication and not take antibiotic since his labs are normal otherwise.-- he informed me that his father has had blood in his urine since he was a teenager but never had further testing for it. Would like to come back for UA in a week or so to see if its pain medication. -- Please Advise.

## 2016-12-13 NOTE — Progress Notes (Signed)
Pt informed- he said he will be back to get recheck on UA

## 2016-12-14 ENCOUNTER — Encounter: Payer: BLUE CROSS/BLUE SHIELD | Admitting: Internal Medicine

## 2017-03-19 DIAGNOSIS — Z86018 Personal history of other benign neoplasm: Secondary | ICD-10-CM | POA: Diagnosis not present

## 2017-03-19 DIAGNOSIS — D485 Neoplasm of uncertain behavior of skin: Secondary | ICD-10-CM | POA: Diagnosis not present

## 2017-07-30 ENCOUNTER — Ambulatory Visit (INDEPENDENT_AMBULATORY_CARE_PROVIDER_SITE_OTHER): Payer: BLUE CROSS/BLUE SHIELD | Admitting: Internal Medicine

## 2017-07-30 ENCOUNTER — Encounter: Payer: Self-pay | Admitting: Internal Medicine

## 2017-07-30 VITALS — BP 124/80 | HR 92 | Ht 72.0 in | Wt 256.0 lb

## 2017-07-30 DIAGNOSIS — R079 Chest pain, unspecified: Secondary | ICD-10-CM | POA: Diagnosis not present

## 2017-07-30 DIAGNOSIS — Z23 Encounter for immunization: Secondary | ICD-10-CM | POA: Insufficient documentation

## 2017-07-30 DIAGNOSIS — R9431 Abnormal electrocardiogram [ECG] [EKG]: Secondary | ICD-10-CM | POA: Diagnosis not present

## 2017-07-30 HISTORY — DX: Abnormal electrocardiogram (ECG) (EKG): R94.31

## 2017-07-30 NOTE — Progress Notes (Signed)
Date:  07/30/2017   Name:  Max Russell   DOB:  03/30/1981   MRN:  [id number]   Chief Complaint: chest discomfort (2 weeks- minor. Not consistant. Friday had an episode started to fee cold waves over chest/ lightheaded. Pulse was not changed. Was making efforts to breathe deeply and noticing heart rate more. Job is extremely stressful. Was home on vacation that day. )  Chest Pain   This is a new problem. The current episode started 1 to 4 weeks ago. The problem occurs intermittently. The pain is present in the lateral region. The pain is mild. The quality of the pain is described as pressure and dull. The pain does not radiate. Associated symptoms include back pain (chronic), dizziness, headaches and palpitations. Pertinent negatives include no abdominal pain, cough, exertional chest pressure, fever, irregular heartbeat, shortness of breath or vomiting. The pain is aggravated by emotional upset. He has tried nothing for the symptoms.   Chest pain in the left axillary region, does not radiate and is not associated with exertion.  It occurs at rest and only lasts a few minutes. There is no CP, SOB,  N/V.  He had a worse episode 5 days ago that was associated with some feeling of cold washing over his chest.   He does not smoke, is not diabetic or hypertensive, no fam hx of premature CAD.  He has not had general SOB or leg swelling.  No recent travel. He is stressed more recently with work and family issues.  Review of Systems  Constitutional: Negative for chills, fatigue and fever.  Eyes: Negative for visual disturbance.  Respiratory: Negative for cough, chest tightness, shortness of breath and wheezing.   Cardiovascular: Positive for chest pain and palpitations. Negative for leg swelling.  Gastrointestinal: Negative for abdominal pain, diarrhea and vomiting.  Musculoskeletal: Positive for back pain (chronic). Negative for arthralgias.  Neurological: Positive for dizziness and headaches.  Negative for tremors and syncope.  Psychiatric/Behavioral: Negative for sleep disturbance.    Patient Active Problem List   Diagnosis Date Noted  . Ulnar tunnel syndrome of left wrist 12/12/2016  . Testicular abnormality 06/04/2016  . Hx of dysplastic nevus 12/07/2015  . Long term use of drug 12/07/2015  . Abdominal lipoma 07/03/2015  . 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 Authorizing Provider  naproxen sodium (ALEVE) 220 MG tablet Take 1 tablet by mouth 2 (two) times daily as needed.   Yes [provider]  Omega-3 Fatty Acids (FISH OIL) 1000 MG CAPS Take 1 capsule by mouth.   Yes [provider]    No Known Allergies  Past Surgical History:  Procedure Laterality Date  . dyplastic nevus     x 4    Social History   Tobacco Use  . Smoking status: Never Smoker  . Smokeless tobacco: Never Used  Substance Use Topics  . Alcohol use: Yes    Alcohol/week: 2.4 oz    Types: 4 Standard drinks or equivalent per week  . Drug use: No     Medication list has been reviewed and updated.  PHQ 2/9 Scores 07/30/2017  PHQ - 2 Score 0    Physical Exam  Constitutional: He is oriented to person, place, and time. He appears well-developed. No distress.  HENT:  Head: Normocephalic and atraumatic.  Neck: Normal range of motion. Neck supple. Carotid bruit is not present. No thyromegaly present.  Cardiovascular:  Normal rate, regular rhythm, normal heart sounds and intact distal pulses.  No extrasystoles are present. Exam reveals no gallop and no distant heart sounds.  Pulmonary/Chest: Effort normal and breath sounds normal. No respiratory distress. He has no wheezes.  Tender in left axilla and along lateral and anterior lower ribs - re-created the discomfort to some extent.  Musculoskeletal: Normal range of motion.  Neurological: He is alert and oriented to person, place, and time.  Skin: Skin is  warm and dry. No rash noted.  Psychiatric: He has a normal mood and affect. His speech is normal and behavior is normal. Thought content normal.  Nursing note and vitals reviewed.   BP 124/80   Pulse 92   Ht 6' (1.829 m)   Wt 256 lb (116.1 kg)   SpO2 100%   BMI 34.72 kg/m   Assessment and Plan: 1. Chest pain, unspecified type Likely MSK in nature Begin nsaids as needed - EKG 12-Lead - SR @ 91, short PR interval  2. Shortened PR interval No hx of tachycardia - pt urged to follow up if sx change  3. Need for influenza vaccination - Flu Vaccine QUAD 36+ mos IM   No orders of the defined types were placed in this encounter.   Partially dictated using Editor, commissioning. Any errors are unintentional.  Halina Maidens, MD [facility]  07/30/2017

## 2017-09-24 DIAGNOSIS — Z86018 Personal history of other benign neoplasm: Secondary | ICD-10-CM | POA: Diagnosis not present

## 2017-09-24 DIAGNOSIS — L578 Other skin changes due to chronic exposure to nonionizing radiation: Secondary | ICD-10-CM | POA: Diagnosis not present

## 2017-12-18 ENCOUNTER — Ambulatory Visit (INDEPENDENT_AMBULATORY_CARE_PROVIDER_SITE_OTHER): Payer: BLUE CROSS/BLUE SHIELD | Admitting: Internal Medicine

## 2017-12-18 ENCOUNTER — Encounter: Payer: Self-pay | Admitting: Internal Medicine

## 2017-12-18 VITALS — BP 122/78 | HR 74 | Ht 72.0 in | Wt 248.0 lb

## 2017-12-18 DIAGNOSIS — Z0000 Encounter for general adult medical examination without abnormal findings: Secondary | ICD-10-CM | POA: Diagnosis not present

## 2017-12-18 DIAGNOSIS — M4696 Unspecified inflammatory spondylopathy, lumbar region: Secondary | ICD-10-CM | POA: Diagnosis not present

## 2017-12-18 DIAGNOSIS — R319 Hematuria, unspecified: Secondary | ICD-10-CM

## 2017-12-18 DIAGNOSIS — Z79899 Other long term (current) drug therapy: Secondary | ICD-10-CM

## 2017-12-18 LAB — POC URINALYSIS WITH MICROSCOPIC (NON AUTO)MANUAL RESULT
Bacteria, UA: 0
Bilirubin, UA: NEGATIVE
Crystals: 0
Epithelial cells, urine per micros: 0
Glucose, UA: NEGATIVE
Ketones, UA: NEGATIVE
Leukocytes, UA: NEGATIVE
Mucus, UA: 0
Nitrite, UA: NEGATIVE
Protein, UA: NEGATIVE
RBC: 2 M/uL — AB (ref 4.69–6.13)
Spec Grav, UA: <=1.005 — AB (ref 1.010–1.025)
Urobilinogen, UA: 0.2 E.U./dL
WBC Casts, UA: 0
pH, UA: 7.5 (ref 5.0–8.0)

## 2017-12-18 NOTE — Progress Notes (Signed)
Date:  12/18/2017   Name:  Max Russell   DOB:  05-17-1981   MRN:  [id number]   Chief Complaint: Annual Exam Max Russell is a 37 y.o. male who presents today for his Complete Annual Exam. He feels well. He reports exercising walking and rowing. He reports he is sleeping well.  He has lost about 20 lbs in the past few months with diet change and more physical activity.  He has intermittent low back pain that is stable and maybe improving with weight loss and exercise.  He continues to take naproxen as needed.  Hematuria - last year had positive blood on dipstick but was unable to do micro (sample discarded).  Pt was asked to return for recheck but never followed up.  He does not see blood in his urine.  He says that his father has microscopic hematuria since a young age and he assumed that it was hereditary and of no concern.  Review of Systems  Constitutional: Negative for appetite change, chills, diaphoresis, fatigue and unexpected weight change.  HENT: Negative for hearing loss, tinnitus, trouble swallowing and voice change.   Eyes: Negative for visual disturbance.  Respiratory: Negative for choking, shortness of breath and wheezing.   Cardiovascular: Negative for chest pain, palpitations and leg swelling.  Gastrointestinal: Negative for abdominal pain, blood in stool, constipation and diarrhea.  Genitourinary: Negative for difficulty urinating, dysuria, frequency and hematuria.  Musculoskeletal: Positive for back pain (mild intermittent low back pain). Negative for arthralgias and myalgias.  Skin: Negative for color change and rash.  Neurological: Negative for dizziness, syncope and headaches.  Hematological: Negative for adenopathy.  Psychiatric/Behavioral: Negative for dysphoric mood and sleep disturbance.    Patient Active Problem List   Diagnosis Date Noted  . Shortened PR interval 07/30/2017  . Ulnar tunnel syndrome of left wrist 12/12/2016  . Testicular  abnormality 06/04/2016  . Hx of dysplastic nevus 12/07/2015  . Long term use of drug 12/07/2015  . Abdominal lipoma 07/03/2015  . 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 Authorizing Provider  naproxen sodium (ALEVE) 220 MG tablet Take 1 tablet by mouth 2 (two) times daily as needed.   Yes [provider]  Omega-3 Fatty Acids (FISH OIL) 1000 MG CAPS Take 1 capsule by mouth.   Yes [provider]    No Known Allergies  Past Surgical History:  Procedure Laterality Date  . dyplastic nevus     x 4    Social History   Tobacco Use  . Smoking status: Never Smoker  . Smokeless tobacco: Never Used  Substance Use Topics  . Alcohol use: Yes    Alcohol/week: 2.4 oz    Types: 4 Standard drinks or equivalent per week  . Drug use: No     Medication list has been reviewed and updated.  PHQ 2/9 Scores 07/30/2017  PHQ - 2 Score 0    Physical Exam  Constitutional: He is oriented to person, place, and time. He appears well-developed and well-nourished.  HENT:  Head: Normocephalic.  Right Ear: Tympanic membrane, external ear and ear canal normal.  Left Ear: Tympanic membrane, external ear and ear canal normal.  Nose: Nose normal.  Mouth/Throat: Uvula is midline and oropharynx is clear and moist.  Eyes: Pupils are equal, round, and reactive to light. Conjunctivae and EOM are normal.  Neck: Normal range of motion. Neck supple. Carotid bruit is not  present. No thyromegaly present.  Cardiovascular: Normal rate, regular rhythm, normal heart sounds and intact distal pulses.  Pulmonary/Chest: Effort normal and breath sounds normal. He has no wheezes. Right breast exhibits no mass. Left breast exhibits no mass.  Abdominal: Soft. Normal appearance and bowel sounds are normal. There is no hepatosplenomegaly. There is no tenderness.  Musculoskeletal: Normal range of motion.  Lymphadenopathy:    He has no  cervical adenopathy.  Neurological: He is alert and oriented to person, place, and time. He has normal reflexes.  Skin: Skin is warm, dry and intact.  Psychiatric: He has a normal mood and affect. His speech is normal and behavior is normal. Judgment and thought content normal.  Nursing note and vitals reviewed.   BP 122/78   Pulse 74   Ht 6' (1.829 m)   Wt 248 lb (112.5 kg)   SpO2 100%   BMI 33.63 kg/m   Assessment and Plan: 1. Annual physical exam Continue diet changes and exercise for ongoing weight loss - Hemoglobin A1c - Lipid panel - TSH  2. Long term use of drug nsaids use for back pain - CBC with Differential/Platelet - Comprehensive metabolic panel  3. 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 Hematuria, unspecified type Persistent presence of RBC in urine with family hx - Ambulatory referral to Urology - POC urinalysis w microscopic (non auto)   No orders of the defined types were placed in this encounter.   Partially dictated using Editor, commissioning. Any errors are unintentional.  Halina Maidens, MD [facility]  12/18/2017

## 2017-12-19 LAB — HEMOGLOBIN A1C
Est. average glucose Bld gHb Est-mCnc: 100 mg/dL
Hgb A1c MFr Bld: 5.1 % (ref 4.8–5.6)

## 2017-12-19 LAB — COMPREHENSIVE METABOLIC PANEL
ALT: 22 IU/L (ref 0–44)
AST: 22 IU/L (ref 0–40)
Albumin/Globulin Ratio: 1.5 (ref 1.2–2.2)
Albumin: 4.5 g/dL (ref 3.5–5.5)
Alkaline Phosphatase: 66 IU/L (ref 39–117)
BUN/Creatinine Ratio: 14 (ref 9–20)
BUN: 12 mg/dL (ref 6–20)
Bilirubin Total: 0.8 mg/dL (ref 0.0–1.2)
CO2: 25 mmol/L (ref 20–29)
Calcium: 9.7 mg/dL (ref 8.7–10.2)
Chloride: 96 mmol/L (ref 96–106)
Creatinine, Ser: 0.87 mg/dL (ref 0.76–1.27)
GFR calc Af Amer: 127 mL/min/{1.73_m2} (ref >59)
GFR calc non Af Amer: 109 mL/min/{1.73_m2} (ref >59)
Globulin, Total: 3 g/dL (ref 1.5–4.5)
Glucose: 74 mg/dL (ref 65–99)
Potassium: 4.7 mmol/L (ref 3.5–5.2)
Sodium: 139 mmol/L (ref 134–144)
Total Protein: 7.5 g/dL (ref 6.0–8.5)

## 2017-12-19 LAB — CBC WITH DIFFERENTIAL/PLATELET
Basophils Absolute: 0 10*3/uL (ref 0.0–0.2)
Basos: 0 %
EOS (ABSOLUTE): 0.1 10*3/uL (ref 0.0–0.4)
Eos: 1 %
Hematocrit: 44 % (ref 37.5–51.0)
Hemoglobin: 14.4 g/dL (ref 13.0–17.7)
Immature Grans (Abs): 0 10*3/uL (ref 0.0–0.1)
Immature Granulocytes: 0 %
Lymphocytes Absolute: 2.2 10*3/uL (ref 0.7–3.1)
Lymphs: 28 %
MCH: 28.1 pg (ref 26.6–33.0)
MCHC: 32.7 g/dL (ref 31.5–35.7)
MCV: 86 fL (ref 79–97)
Monocytes Absolute: 0.5 10*3/uL (ref 0.1–0.9)
Monocytes: 6 %
Neutrophils Absolute: 5 10*3/uL (ref 1.4–7.0)
Neutrophils: 65 %
Platelets: 332 10*3/uL (ref 150–379)
RBC: 5.13 x10E6/uL (ref 4.14–5.80)
RDW: 13.1 % (ref 12.3–15.4)
WBC: 7.8 10*3/uL (ref 3.4–10.8)

## 2017-12-19 LAB — LIPID PANEL
Chol/HDL Ratio: 4.3 ratio (ref 0.0–5.0)
Cholesterol, Total: 172 mg/dL (ref 100–199)
HDL: 40 mg/dL (ref >39)
LDL Calculated: 122 mg/dL — ABNORMAL HIGH (ref 0–99)
Triglycerides: 51 mg/dL (ref 0–149)
VLDL Cholesterol Cal: 10 mg/dL (ref 5–40)

## 2017-12-19 LAB — TSH: TSH: 0.818 u[IU]/mL (ref 0.450–4.500)

## 2018-01-16 ENCOUNTER — Ambulatory Visit: Payer: BLUE CROSS/BLUE SHIELD

## 2018-01-31 ENCOUNTER — Ambulatory Visit (INDEPENDENT_AMBULATORY_CARE_PROVIDER_SITE_OTHER): Payer: BLUE CROSS/BLUE SHIELD | Admitting: Urology

## 2018-01-31 ENCOUNTER — Encounter: Payer: Self-pay | Admitting: Urology

## 2018-01-31 VITALS — BP 121/77 | HR 76 | Ht 71.0 in | Wt 245.0 lb

## 2018-01-31 DIAGNOSIS — R3129 Other microscopic hematuria: Secondary | ICD-10-CM

## 2018-01-31 LAB — MICROSCOPIC EXAMINATION: Bacteria, UA: NONE SEEN

## 2018-01-31 LAB — URINALYSIS, COMPLETE
Bilirubin, UA: NEGATIVE
Glucose, UA: NEGATIVE
Ketones, UA: NEGATIVE
Leukocytes, UA: NEGATIVE
Nitrite, UA: NEGATIVE
Protein, UA: NEGATIVE
Specific Gravity, UA: 1.01 (ref 1.005–1.030)
Urobilinogen, Ur: 0.2 mg/dL (ref 0.2–1.0)
pH, UA: 7.5 (ref 5.0–7.5)

## 2018-01-31 NOTE — Progress Notes (Signed)
01/31/2018 3:42 PM   [name] 1981/03/03 [id number]  Referring provider: Glean Hess, MD [address] [place], [place] [address]  Chief Complaint  Patient presents with  . Hematuria    HPI: 37 year old male referred for further evaluation of microscopic hematuria.  He reports that last year on urinalysis performed by his primary care physician on 12/12/2016, he was noted to have a small amount of blood in his urine.  In fact, he did have 5 red blood cells per field on this occasion.  He had a repeat urinalysis this year 12/18/2017 which showed trace blood on dip and 2 red blood cells per high-powered field, otherwise unremarkable.  His UA today is also unremarkable.  He denies any gross hematuria.  Never smoker.  No obvious chemical exposures.  He has been seen and evaluated once in our office in 2017 are slightly abnormal testicular lie without pain or masses.  This is stable.  He denies any testicular issues.  He does report that his father has a personal history of microscopic hematuria of unknown origin.  He has had a work-up in the past.  No flank pain.  No history of kidney stones.  No previous cross-sectional imaging.   PMH: Past Medical History:  Diagnosis Date  . Abdominal lipoma 07/03/2015  . 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 Hx of dysplastic nevus 12/07/2015  . Long term use of drug 12/07/2015  . 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    Surgical History: Past Surgical History:  Procedure Laterality Date  . dyplastic nevus     x 4    Home Medications:  Allergies as of 01/31/2018   No Known Allergies     Medication List        Accurate as of 01/31/18 11:59 PM. Always use your most recent med list.          ALEVE 220 MG tablet Generic drug:  naproxen sodium Take 1 tablet by mouth 2 (two) times daily as needed.   Fish Oil 1000 MG Caps Take 1 capsule  by mouth.       Allergies: No Known Allergies  Family History: Family History  Problem Relation Age of Onset  . Hyperlipidemia Mother   . Hypertension Mother   . Gallstones Mother   . Prostate cancer Father 25  . Kidney cancer Neg Hx     Social History:  reports that he has never smoked. He has never used smokeless tobacco. He reports that he drinks about 2.4 oz of alcohol per week. He reports that he does not use drugs.  ROS: UROLOGY Frequent Urination?: No Hard to postpone urination?: No Burning/pain with urination?: No Get up at night to urinate?: No Leakage of urine?: No Urine stream starts and stops?: No Trouble starting stream?: No Do you have to strain to urinate?: No Blood in urine?: Yes Urinary tract infection?: No Sexually transmitted disease?: No Injury to kidneys or bladder?: No Painful intercourse?: No Weak stream?: No Erection problems?: No Penile pain?: No  Gastrointestinal Nausea?: No Vomiting?: No Indigestion/heartburn?: No Diarrhea?: No Constipation?: No  Constitutional Fever: No Night sweats?: No Weight loss?: No Fatigue?: No  Skin Skin rash/lesions?: No Itching?: No  Eyes Blurred vision?: No Double vision?: No  Ears/Nose/Throat Sore throat?: No Sinus problems?: No  Hematologic/Lymphatic Swollen glands?: No Easy bruising?: No  Cardiovascular Leg swelling?: No Chest pain?: No  Respiratory Cough?:  No Shortness of breath?: No  Endocrine Excessive thirst?: No  Musculoskeletal Back pain?: Yes Joint pain?: No  Neurological Headaches?: No Dizziness?: No  Psychologic Depression?: No Anxiety?: No  Physical Exam: BP 121/77   Pulse 76   Ht 5\' 11"  (1.803 m)   Wt 245 lb (111.1 kg)   BMI 34.17 kg/m   Constitutional:  Alert and oriented, No acute distress. HEENT: [place] AT, moist mucus membranes.  Trachea midline, no masses. Cardiovascular: No clubbing, cyanosis, or edema. Respiratory: Normal respiratory effort, no  increased work of breathing. GI: Abdomen is soft, nontender, nondistended, no abdominal masses GU: Circumcised phallus with orthotopic meatus.  Bilateral descended testicles, left testicle somewhat retractile with a slightly aberrant lie, no masses bilaterally.  Nontender. Skin: No rashes, bruises or suspicious lesions. Neurologic: Grossly intact, no focal deficits, moving all 4 extremities. Psychiatric: Normal mood and affect.  Laboratory Data: Lab Results  Component Value Date   WBC 7.8 12/18/2017   HGB 14.4 12/18/2017   HCT 44.0 12/18/2017   MCV 86 12/18/2017   PLT 332 12/18/2017    Lab Results  Component Value Date   CREATININE 0.87 12/18/2017    Lab Results  Component Value Date   HGBA1C 5.1 12/18/2017    Urinalysis Results for orders placed or performed in visit on 01/31/18  Microscopic Examination  Result Value Ref Range   WBC, UA 0-5 0 - 5 /hpf   RBC, UA 0-2 0 - 2 /hpf   Epithelial Cells (non renal) 0-10 0 - 10 /hpf   Bacteria, UA None seen None seen/Few  Urinalysis, Complete  Result Value Ref Range   Specific Gravity, UA 1.010 1.005 - 1.030   pH, UA 7.5 5.0 - 7.5   Color, UA Yellow Yellow   Appearance Ur Clear Clear   Leukocytes, UA Negative Negative   Protein, UA Negative Negative/Trace   Glucose, UA Negative Negative   Ketones, UA Negative Negative   RBC, UA Trace (A) Negative   Bilirubin, UA Negative Negative   Urobilinogen, Ur 0.2 0.2 - 1.0 mg/dL   Nitrite, UA Negative Negative   Microscopic Examination See below:     Pertinent Imaging: N/a  Assessment & Plan:    1. Microscopic hematuria We discussed the differential diagnosis for microscopic hematuria including nephrolithiasis, renal or upper tract tumors, bladder stones, UTIs, or bladder tumors as well as undetermined etiologies.  He has had microscopic blood on one occasion meeting the criteria which is last year.  UA today unremarkable.  Given his relatively young age and few risk  factors, I recommended a modified renal ultrasound as well as office cystoscopy.  Will defer delayed upper tract ureteral imaging given that he is relatively low risk to avoid significant radiation exposure and coughs.  He is agreeable this plan.  - Urinalysis, Complete - US RENAL; Future   Return in about 1 month (around 02/28/2018) for RUS (f/u) and cystoscopy.  Hollice Espy, MD  [facility] [address] [place], [place] [address] [phone]

## 2018-02-10 ENCOUNTER — Ambulatory Visit: Payer: Self-pay

## 2018-03-14 ENCOUNTER — Telehealth: Payer: Self-pay | Admitting: Urology

## 2018-03-14 NOTE — Telephone Encounter (Signed)
Just wanted to let you know that this patient called today and cx his cysto. I tried to get him to reschd but he said he did not want to. And would not give a reason.   Sharyn Lull

## 2018-03-25 ENCOUNTER — Other Ambulatory Visit: Payer: BLUE CROSS/BLUE SHIELD | Admitting: Urology

## 2018-07-21 ENCOUNTER — Encounter: Payer: Self-pay | Admitting: Internal Medicine

## 2018-07-21 ENCOUNTER — Ambulatory Visit: Payer: BLUE CROSS/BLUE SHIELD | Admitting: Internal Medicine

## 2018-07-21 VITALS — BP 121/78 | HR 78 | Resp 16 | Ht 71.0 in | Wt 247.0 lb

## 2018-07-21 DIAGNOSIS — H6983 Other specified disorders of Eustachian tube, bilateral: Secondary | ICD-10-CM

## 2018-07-21 NOTE — Progress Notes (Signed)
Date:  07/21/2018   Name:  Max Russell   DOB:  10/21/1980   MRN:  [id number]   Chief Complaint: Dizziness (was seen 07/16/2018 in [employer] at [facility] and EKG and Blood Pressure was WNL Blood Count Elevated and DX with Dizziness and Acute Sinusitis ) and Headache (comes with the dizzy spells)  Palpitations   This is a new problem. The current episode started 1 to 4 weeks ago. The problem occurs intermittently. Pertinent negatives include no chest pain, coughing, dizziness, fever, shortness of breath or weakness. He has tried nothing for the symptoms.  Sinusitis  This is a new problem. The current episode started in the past 7 days. The problem has been gradually improving since onset. Associated symptoms include sinus pressure. Pertinent negatives include no chills, coughing, ear pain, headaches, shortness of breath or sore throat. Treatments tried: treated by UC with antibiotics.  he was told that his EKG was normal, and he was monitored for several hours.  He was also told that his WBC was elevated.  He does not have the values. Given amox and meclizine on 11/201/9. He still feels like his head is full and he gets lightheaded when he stands up from resting. He wonders if he is having anxiety.  Review of Systems  Constitutional: Negative for chills, fatigue and fever.  HENT: Positive for sinus pressure. Negative for ear pain, sore throat, tinnitus and trouble swallowing.   Respiratory: Negative for cough, chest tightness and shortness of breath.   Cardiovascular: Positive for palpitations. Negative for chest pain.  Neurological: Negative for dizziness, tremors, weakness, light-headedness and headaches.    Patient Active Problem List   Diagnosis Date Noted  . 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 Shortened PR interval 07/30/2017  . Ulnar tunnel syndrome of left wrist 12/12/2016  . Testicular abnormality 06/04/2016  . Hx of dysplastic nevus  12/07/2015  . Long term use of drug 12/07/2015  . Abdominal lipoma 07/03/2015  . Facet syndrome, lumbar 07/03/2015    No Known Allergies  Past Surgical History:  Procedure Laterality Date  . dyplastic nevus     x 4    Social History   Tobacco Use  . Smoking status: Never Smoker  . Smokeless tobacco: Never Used  Substance Use Topics  . Alcohol use: Yes    Alcohol/week: 4.0 standard drinks    Types: 4 Standard drinks or equivalent per week  . Drug use: No     Medication list has been reviewed and updated.  Current Meds  Medication Sig  . acetaminophen (TYLENOL) 325 MG tablet Take 650 mg by mouth every 6 (six) hours as needed.  Marland Kitchen amoxicillin (AMOXIL) 875 MG tablet Take 875 mg by mouth 2 (two) times daily.  . meclizine (ANTIVERT) 25 MG tablet Take 25 mg by mouth 3 (three) times daily as needed for dizziness.  . naproxen sodium (ALEVE) 220 MG tablet Take 1 tablet by mouth 2 (two) times daily as needed.  . Omega-3 Fatty Acids (FISH OIL) 1000 MG CAPS Take 1 capsule by mouth.    PHQ 2/9 Scores 07/30/2017  PHQ - 2 Score 0    Physical Exam  Constitutional: He is oriented to person, place, and time. He appears well-developed and well-nourished. No distress.  HENT:  Head: Normocephalic and atraumatic.  Right Ear: External ear and ear canal normal. Tympanic membrane is bulging. Tympanic membrane is not erythematous and not retracted.  Left Ear: External ear and ear canal  normal. Tympanic membrane is bulging. Tympanic membrane is not erythematous and not retracted.  Nose: Right sinus exhibits maxillary sinus tenderness. Right sinus exhibits no frontal sinus tenderness. Left sinus exhibits maxillary sinus tenderness. Left sinus exhibits no frontal sinus tenderness.  Mouth/Throat: Uvula is midline and mucous membranes are normal. No oral lesions. No oropharyngeal exudate or posterior oropharyngeal erythema.  Cardiovascular: Normal rate, regular rhythm and normal heart sounds.    Pulmonary/Chest: Effort normal and breath sounds normal. No respiratory distress. He has no wheezes. He has no rales.  Musculoskeletal: Normal range of motion.  Lymphadenopathy:    He has no cervical adenopathy.  Neurological: He is alert and oriented to person, place, and time.  Skin: Skin is warm and dry. No rash noted.  Psychiatric: He has a normal mood and affect. His behavior is normal. Thought content normal.  Nursing note and vitals reviewed.   BP 121/78   Pulse 78   Resp 16   Ht 5\' 11"  (1.803 m)   Wt 247 lb (112 kg)   SpO2 98%   BMI 34.45 kg/m   Assessment and Plan: 1. Dysfunction of both eustachian tubes Finish amoxicillin Take sudafed 30 mg bid Increase fluids Return if sx continue Will get labs from [place] to determine if follow up is needed    Partially dictated using Editor, commissioning. Any errors are unintentional.  Halina Maidens, MD [facility]  07/21/2018

## 2018-07-21 NOTE — Patient Instructions (Signed)
Sudafed 30 mg one twice a day (8 AM and 2-3 PM)  Finish Antibiotics

## 2018-07-28 ENCOUNTER — Other Ambulatory Visit: Payer: Self-pay | Admitting: Internal Medicine

## 2018-07-28 DIAGNOSIS — J019 Acute sinusitis, unspecified: Secondary | ICD-10-CM

## 2018-07-28 MED ORDER — AZITHROMYCIN 250 MG PO TABS: ORAL_TABLET | ORAL | 0 refills | Status: AC

## 2018-09-25 DIAGNOSIS — Z86018 Personal history of other benign neoplasm: Secondary | ICD-10-CM | POA: Diagnosis not present

## 2018-09-25 DIAGNOSIS — L578 Other skin changes due to chronic exposure to nonionizing radiation: Secondary | ICD-10-CM | POA: Diagnosis not present

## 2018-09-25 DIAGNOSIS — D1800 Hemangioma unspecified site: Secondary | ICD-10-CM | POA: Diagnosis not present

## 2018-09-25 DIAGNOSIS — Z1283 Encounter for screening for malignant neoplasm of skin: Secondary | ICD-10-CM | POA: Diagnosis not present

## 2018-12-23 ENCOUNTER — Encounter: Payer: BLUE CROSS/BLUE SHIELD | Admitting: Internal Medicine

## 2019-01-27 ENCOUNTER — Encounter: Payer: Self-pay | Admitting: Internal Medicine

## 2019-01-27 ENCOUNTER — Other Ambulatory Visit: Payer: Self-pay

## 2019-01-27 ENCOUNTER — Telehealth: Payer: Self-pay

## 2019-01-27 ENCOUNTER — Other Ambulatory Visit: Payer: Self-pay | Admitting: Internal Medicine

## 2019-01-27 ENCOUNTER — Ambulatory Visit (INDEPENDENT_AMBULATORY_CARE_PROVIDER_SITE_OTHER): Payer: BC Managed Care – PPO | Admitting: Internal Medicine

## 2019-01-27 VITALS — BP 120/80 | HR 80 | Ht 72.0 in | Wt 253.0 lb

## 2019-01-27 DIAGNOSIS — E785 Hyperlipidemia, unspecified: Secondary | ICD-10-CM | POA: Insufficient documentation

## 2019-01-27 DIAGNOSIS — R3129 Other microscopic hematuria: Secondary | ICD-10-CM | POA: Insufficient documentation

## 2019-01-27 DIAGNOSIS — M47816 Spondylosis without myelopathy or radiculopathy, lumbar region: Secondary | ICD-10-CM | POA: Diagnosis not present

## 2019-01-27 DIAGNOSIS — Z23 Encounter for immunization: Secondary | ICD-10-CM

## 2019-01-27 DIAGNOSIS — B354 Tinea corporis: Secondary | ICD-10-CM

## 2019-01-27 DIAGNOSIS — Z0000 Encounter for general adult medical examination without abnormal findings: Secondary | ICD-10-CM

## 2019-01-27 DIAGNOSIS — Z79899 Other long term (current) drug therapy: Secondary | ICD-10-CM

## 2019-01-27 LAB — POCT URINALYSIS DIPSTICK
Bilirubin, UA: NEGATIVE
Glucose, UA: NEGATIVE
Ketones, UA: NEGATIVE
Leukocytes, UA: NEGATIVE
Nitrite, UA: NEGATIVE
Protein, UA: NEGATIVE
Spec Grav, UA: 1.02 (ref 1.010–1.025)
Urobilinogen, UA: 0.2 E.U./dL
pH, UA: 6 (ref 5.0–8.0)

## 2019-01-27 MED ORDER — NYSTATIN-TRIAMCINOLONE 100000-0.1 UNIT/GM-% EX OINT: 1.0000 "application " | TOPICAL_OINTMENT | Freq: Two times a day (BID) | CUTANEOUS | 1 refills | Status: DC

## 2019-01-27 MED ORDER — FLUCONAZOLE 100 MG PO TABS: 100.0000 mg | ORAL_TABLET | Freq: Once | ORAL | 0 refills | Status: AC

## 2019-01-27 NOTE — Patient Instructions (Addendum)
You can use cortisone cream to the external ear canals as needed for itching.  Health Maintenance, Male A healthy lifestyle and preventive care is important for your health and wellness. Ask your health care provider about what schedule of regular examinations is right for you. What should I know about weight and diet? Eat a Healthy Diet  Eat plenty of vegetables, fruits, whole grains, low-fat dairy products, and lean protein.  Do not eat a lot of foods high in solid fats, added sugars, or salt.  Maintain a Healthy Weight Regular exercise can help you achieve or maintain a healthy weight. You should:  Do at least 150 minutes of exercise each week. The exercise should increase your heart rate and make you sweat (moderate-intensity exercise).  Do strength-training exercises at least twice a week. Watch Your Levels of Cholesterol and Blood Lipids  Have your blood tested for lipids and cholesterol every 5 years starting at 40 years of age. If you are at high risk for heart disease, you should start having your blood tested when you are 40 years old. You may need to have your cholesterol levels checked more often if: ? Your lipid or cholesterol levels are high. ? You are older than 40 years of age. ? You are at high risk for heart disease. What should I know about cancer screening? Many types of cancers can be detected early and may often be prevented. Lung Cancer  You should be screened every year for lung cancer if: ? You are a current smoker who has smoked for at least 30 years. ? You are a former smoker who has quit within the past 15 years.  Talk to your health care provider about your screening options, when you should start screening, and how often you should be screened. Colorectal Cancer  Routine colorectal cancer screening usually begins at 40 years of age and should be repeated every 5-10 years until you are 40 years old. You may need to be screened more often if early forms  of precancerous polyps or small growths are found. Your health care provider may recommend screening at an earlier age if you have risk factors for colon cancer.  Your health care provider may recommend using home test kits to check for hidden blood in the stool.  A small camera at the end of a tube can be used to examine your colon (sigmoidoscopy or colonoscopy). This checks for the earliest forms of colorectal cancer. Prostate and Testicular Cancer  Depending on your age and overall health, your health care provider may do certain tests to screen for prostate and testicular cancer.  Talk to your health care provider about any symptoms or concerns you have about testicular or prostate cancer. Skin Cancer  Check your skin from head to toe regularly.  Tell your health care provider about any new moles or changes in moles, especially if: ? There is a change in a mole's size, shape, or color. ? You have a mole that is larger than a pencil eraser.  Always use sunscreen. Apply sunscreen liberally and repeat throughout the day.  Protect yourself by wearing long sleeves, pants, a wide-brimmed hat, and sunglasses when outside. What should I know about heart disease, diabetes, and high blood pressure?  If you are 14-72 years of age, have your blood pressure checked every 3-5 years. If you are 45 years of age or older, have your blood pressure checked every year. You should have your blood pressure measured twice-once when you  are at a hospital or clinic, and once when you are not at a hospital or clinic. Record the average of the two measurements. To check your blood pressure when you are not at a hospital or clinic, you can use: ? An automated blood pressure machine at a pharmacy. ? A home blood pressure monitor.  Talk to your health care provider about your target blood pressure.  If you are between 68-59 years old, ask your health care provider if you should take aspirin to prevent heart  disease.  Have regular diabetes screenings by checking your fasting blood sugar level. ? If you are at a normal weight and have a low risk for diabetes, have this test once every three years after the age of 54. ? If you are overweight and have a high risk for diabetes, consider being tested at a younger age or more often.  A one-time screening for abdominal aortic aneurysm (AAA) by ultrasound is recommended for men aged 31-75 years who are current or former smokers. What should I know about preventing infection? Hepatitis B If you have a higher risk for hepatitis B, you should be screened for this virus. Talk with your health care provider to find out if you are at risk for hepatitis B infection. Hepatitis C Blood testing is recommended for:  Everyone born from [birth year] through 1967.  Anyone with known risk factors for hepatitis C. Sexually Transmitted Diseases (STDs)  You should be screened each year for STDs including gonorrhea and chlamydia if: ? You are sexually active and are younger than 40 years of age. ? You are older than 40 years of age and your health care provider tells you that you are at risk for this type of infection. ? Your sexual activity has changed since you were last screened and you are at an increased risk for chlamydia or gonorrhea. Ask your health care provider if you are at risk.  Talk with your health care provider about whether you are at high risk of being infected with HIV. Your health care provider may recommend a prescription medicine to help prevent HIV infection. What else can I do?  Schedule regular health, dental, and eye exams.  Stay current with your vaccines (immunizations).  Do not use any tobacco products, such as cigarettes, chewing tobacco, and e-cigarettes. If you need help quitting, ask your health care provider.  Limit alcohol intake to no more than 2 drinks per day. One drink equals 12 ounces of beer, 5 ounces of wine, or 1 ounces of hard  liquor.  Do not use street drugs.  Do not share needles.  Ask your health care provider for help if you need support or information about quitting drugs.  Tell your health care provider if you often feel depressed.  Tell your health care provider if you have ever been abused or do not feel safe at home. This information is not intended to replace advice given to you by your health care provider. Make sure you discuss any questions you have with your health care provider. Document Released: 02/09/2008 Document Revised: 04/11/2016 Document Reviewed: 05/17/2015 Elsevier Interactive Patient Education  2019 [employer]. Tdap Vaccine (Tetanus, Diphtheria and Pertussis): What You Need to Know 1. Why get vaccinated? Tetanus, diphtheria and pertussis are very serious diseases. Tdap vaccine can protect [place] from these diseases. And, Tdap vaccine given to pregnant women can protect newborn babies against pertussis.Marland Kitchen TETANUS (Lockjaw) is rare in the [place] today. It causes painful muscle tightening  and stiffness, usually all over the body.  It can lead to tightening of muscles in the head and neck so you can't open your mouth, swallow, or sometimes even breathe. Tetanus kills about 1 out of 10 people who are infected even after receiving the best medical care. DIPHTHERIA is also rare in the [place] today. It can cause a thick coating to form in the back of the throat.  It can lead to breathing problems, heart failure, paralysis, and death. PERTUSSIS (Whooping Cough) causes severe coughing spells, which can cause difficulty breathing, vomiting and disturbed sleep.  It can also lead to weight loss, incontinence, and rib fractures. Up to 2 in 100 adolescents and 5 in 100 adults with pertussis are hospitalized or have complications, which could include pneumonia or death. These diseases are caused by bacteria. Diphtheria and pertussis are spread from person to person through secretions from  coughing or sneezing. Tetanus enters the body through cuts, scratches, or wounds. Before vaccines, as many as 200,000 cases of diphtheria, 200,000 cases of pertussis, and hundreds of cases of tetanus, were reported in the [place] each year. Since vaccination began, reports of cases for tetanus and diphtheria have dropped by about 99% and for pertussis by about 80%. 2. Tdap vaccine Tdap vaccine can protect adolescents and adults from tetanus, diphtheria, and pertussis. One dose of Tdap is routinely given at age 57 or 74. People who did not get Tdap at that age should get it as soon as possible. Tdap is especially important for healthcare professionals and anyone having close contact with a baby younger than 12 months. Pregnant women should get a dose of Tdap during every pregnancy, to protect the newborn from pertussis. Infants are most at risk for severe, life-threatening complications from pertussis. Another vaccine, called Td, protects against tetanus and diphtheria, but not pertussis. A Td booster should be given every 10 years. Tdap may be given as one of these boosters if you have never gotten Tdap before. Tdap may also be given after a severe cut or burn to prevent tetanus infection. Your doctor or the person giving you the vaccine can give you more information. Tdap may safely be given at the same time as other vaccines. 3. Some people should not get this vaccine  A person who has ever had a life-threatening allergic reaction after a previous dose of any diphtheria, tetanus or pertussis containing vaccine, OR has a severe allergy to any part of this vaccine, should not get Tdap vaccine. Tell the person giving the vaccine about any severe allergies.  Anyone who had coma or long repeated seizures within 7 days after a childhood dose of DTP or DTaP, or a previous dose of Tdap, should not get Tdap, unless a cause other than the vaccine was found. They can still get Td.  Talk to your doctor if  you: ? have seizures or another nervous system problem, ? had severe pain or swelling after any vaccine containing diphtheria, tetanus or pertussis, ? ever had a condition called Guillain-Barr Syndrome (GBS), ? aren't feeling well on the day the shot is scheduled. 4. Risks With any medicine, including vaccines, there is a chance of side effects. These are usually mild and go away on their own. Serious reactions are also possible but are rare. Most people who get Tdap vaccine do not have any problems with it. Mild problems following Tdap (Did not interfere with activities)  Pain where the shot was given (about 3 in 4 adolescents  or 2 in 3 adults)  Redness or swelling where the shot was given (about 1 person in 5)  Mild fever of at least 100.48F (up to about 1 in 25 adolescents or 1 in 100 adults)  Headache (about 3 or 4 people in 10)  Tiredness (about 1 person in 3 or 4)  Nausea, vomiting, diarrhea, stomach ache (up to 1 in 4 adolescents or 1 in 10 adults)  Chills, sore joints (about 1 person in 10)  Body aches (about 1 person in 3 or 4)  Rash, swollen glands (uncommon) Moderate problems following Tdap (Interfered with activities, but did not require medical attention)  Pain where the shot was given (up to 1 in 5 or 6)  Redness or swelling where the shot was given (up to about 1 in 16 adolescents or 1 in 12 adults)  Fever over 102F (about 1 in 100 adolescents or 1 in 250 adults)  Headache (about 1 in 7 adolescents or 1 in 10 adults)  Nausea, vomiting, diarrhea, stomach ache (up to 1 or 3 people in 100)  Swelling of the entire arm where the shot was given (up to about 1 in 500). Severe problems following Tdap (Unable to perform usual activities; required medical attention)  Swelling, severe pain, bleeding and redness in the arm where the shot was given (rare). Problems that could happen after any vaccine:  People sometimes faint after a medical procedure, including  vaccination. Sitting or lying down for about 15 minutes can help prevent fainting, and injuries caused by a fall. Tell your doctor if you feel dizzy, or have vision changes or ringing in the ears.  Some people get severe pain in the shoulder and have difficulty moving the arm where a shot was given. This happens very rarely.  Any medication can cause a severe allergic reaction. Such reactions from a vaccine are very rare, estimated at fewer than 1 in a million doses, and would happen within a few minutes to a few hours after the vaccination. As with any medicine, there is a very remote chance of a vaccine causing a serious injury or death. The safety of vaccines is always being monitored. For more information, visit: http://www.aguilar.org/ 5. What if there is a serious problem? What should I look for?  Look for anything that concerns you, such as signs of a severe allergic reaction, very high fever, or unusual behavior. Signs of a severe allergic reaction can include hives, swelling of the face and throat, difficulty breathing, a fast heartbeat, dizziness, and weakness. These would usually start a few minutes to a few hours after the vaccination. What should I do?  If you think it is a severe allergic reaction or other emergency that can't wait, call 9-1-1 or get the person to the nearest hospital. Otherwise, call your doctor.  Afterward, the reaction should be reported to the Vaccine Adverse Event Reporting System (VAERS). Your doctor might file this report, or you can do it yourself through the VAERS web site at www.vaers.SamedayNews.es, or by calling 941-583-7001. VAERS does not give medical advice. 6. The National Vaccine Injury Compensation Program The [employer] Vaccine Injury Compensation Program (VICP) is a federal program that was created to compensate people who may have been injured by certain vaccines. Persons who believe they may have been injured by a vaccine can learn about the  program and about filing a claim by calling 612-446-4557 or visiting the [place] website at GoldCloset.com.ee. There is a time limit to file a claim  for compensation. 7. How can I learn more?  Ask your doctor. He or she can give you the vaccine package insert or suggest other sources of information.  Call your local or state health department.  Contact the Centers for Disease Control and Prevention (CDC): ? Call 506-425-2234 (1-800-CDC-INFO) or ? Visit CDC's website at http://hunter.com/ Vaccine Information Statement Tdap Vaccine (10/20/2013) This information is not intended to replace advice given to you by your health care provider. Make sure you discuss any questions you have with your health care provider. Document Released: 02/12/2012 Document Revised: 03/31/2018 Document Reviewed: 03/31/2018 Elsevier Interactive Patient Education  2019 [employer].

## 2019-01-27 NOTE — Progress Notes (Signed)
Date:  01/27/2019   Name:  Max Russell   DOB:  [birth year]   MRN:  [id number]   Chief Complaint: Annual Exam (PHQ9=4) and Rash (on knee as well as "jock itch") Max Russell is a 38 y.o. male who presents today for his Complete Annual Exam. He feels fairly well. He reports exercising some. He reports he is sleeping well.   TDap overdue  Back Pain  This is a recurrent problem. The problem occurs intermittently. The problem is unchanged. The pain is present in the lumbar spine. The pain does not radiate. The pain is mild. Pertinent negatives include no abdominal pain, chest pain, dysuria or headaches. He has tried NSAIDs for the symptoms.  Rash  This is a new problem. The current episode started in the past 7 days. The problem is unchanged. The affected locations include the left lower leg and genitalia. The rash is characterized by itchiness and redness. He was exposed to nothing. Pertinent negatives include no diarrhea, fatigue or shortness of breath. Treatments tried: anti fungal cream. The treatment provided mild relief.    Review of Systems  Constitutional: Negative for appetite change, chills, diaphoresis, fatigue and unexpected weight change.  HENT: Negative for hearing loss, tinnitus, trouble swallowing and voice change.   Eyes: Negative for visual disturbance.  Respiratory: Negative for choking, shortness of breath and wheezing.   Cardiovascular: Negative for chest pain, palpitations and leg swelling.  Gastrointestinal: Negative for abdominal pain, blood in stool, constipation and diarrhea.  Endocrine: Negative for polyuria.  Genitourinary: Negative for difficulty urinating, dysuria and frequency.  Musculoskeletal: Positive for back pain. Negative for arthralgias and myalgias.  Skin: Positive for rash. Negative for color change.  Allergic/Immunologic: Negative for environmental allergies.  Neurological: Negative for dizziness, syncope and headaches.  Hematological:  Negative for adenopathy.  Psychiatric/Behavioral: Negative for dysphoric mood and sleep disturbance.    Patient Active Problem List   Diagnosis Date Noted  . Mild hyperlipidemia 01/27/2019  . Ulnar tunnel syndrome of left wrist 12/12/2016  . Testicular abnormality 06/04/2016  . Hx of dysplastic nevus 12/07/2015  . Long term use of drug 12/07/2015  . Abdominal lipoma 07/03/2015  . Facet syndrome, lumbar 07/03/2015    No Known Allergies  Past Surgical History:  Procedure Laterality Date  . dyplastic nevus     x 4    Social History   Tobacco Use  . Smoking status: Never Smoker  . Smokeless tobacco: Never Used  Substance Use Topics  . Alcohol use: Yes    Alcohol/week: 4.0 standard drinks    Types: 4 Standard drinks or equivalent per week  . Drug use: No     Medication list has been reviewed and updated.  Current Meds  Medication Sig  . naproxen sodium (ALEVE) 220 MG tablet Take 1 tablet by mouth 2 (two) times daily as needed.  . Omega-3 Fatty Acids (FISH OIL) 1000 MG CAPS Take 1 capsule by mouth.  . [DISCONTINUED] acetaminophen (TYLENOL) 325 MG tablet Take 650 mg by mouth every 6 (six) hours as needed.    PHQ 2/9 Scores 01/27/2019 07/30/2017  PHQ - 2 Score 0 0  PHQ- 9 Score 4 -    BP Readings from Last 3 Encounters:  01/27/19 120/80  07/21/18 121/78  01/31/18 121/77    Physical Exam Vitals signs and nursing note reviewed.  Constitutional:      Appearance: Normal appearance. He is well-developed.  HENT:     Head: Normocephalic.  Right Ear: Tympanic membrane, ear canal and external ear normal.     Left Ear: Tympanic membrane, ear canal and external ear normal.     Nose: Nose normal.     Mouth/Throat:     Pharynx: Uvula midline.  Eyes:     Conjunctiva/sclera: Conjunctivae normal.     Pupils: Pupils are equal, round, and reactive to light.  Neck:     Musculoskeletal: Normal range of motion and neck supple.     Thyroid: No thyromegaly.     Vascular: No  carotid bruit.  Cardiovascular:     Rate and Rhythm: Normal rate and regular rhythm.     Heart sounds: Normal heart sounds.  Pulmonary:     Effort: Pulmonary effort is normal.     Breath sounds: Normal breath sounds. No wheezing.  Chest:     Breasts:        Right: No mass.        Left: No mass.  Abdominal:     General: Bowel sounds are normal.     Palpations: Abdomen is soft.     Tenderness: There is no abdominal tenderness.  Musculoskeletal: Normal range of motion.     Right lower leg: No edema.     Left lower leg: No edema.  Lymphadenopathy:     Cervical: No cervical adenopathy.  Skin:    General: Skin is warm and dry.     Findings: Rash present.       Neurological:     Mental Status: He is alert and oriented to person, place, and time.     Deep Tendon Reflexes: Reflexes are normal and symmetric.  Psychiatric:        Speech: Speech normal.        Behavior: Behavior normal.        Thought Content: Thought content normal.        Judgment: Judgment normal.     Wt Readings from Last 3 Encounters:  01/27/19 253 lb (114.8 kg)  07/21/18 247 lb (112 kg)  01/31/18 245 lb (111.1 kg)    BP 120/80   Pulse 80   Ht 6' (1.829 m)   Wt 253 lb (114.8 kg)   SpO2 100%   BMI 34.31 kg/m   Assessment and Plan: 1. Annual physical exam Normal exam except for weight Encourage healthy diet and regular exercise - Hemoglobin A1c - CBC with Differential/Platelet - POCT urinalysis dipstick  2. Mild hyperlipidemia Check labs - Lipid panel  3. Long term use of drug Continue Aleve prn back pain - Comprehensive metabolic panel  4. Facet syndrome, lumbar stable  5. Tinea corporis - nystatin-triamcinolone ointment (MYCOLOG); Apply 1 application topically 2 (two) times daily.  Dispense: 30 g; Refill: 1 - fluconazole (DIFLUCAN) 100 MG tablet; Take 1 tablet (100 mg total) by mouth once for 1 dose.  Dispense: 1 tablet; Refill: 0  6. Need for diphtheria-tetanus-pertussis (Tdap)  vaccine - Tdap vaccine greater than or equal to 7yo IM  7. Microscopic hematuria Seen by Urology last year - recommended Cysto and [place] but patient cancelled the procedures Will advise that he follow up  Partially dictated using Editor, commissioning. Any errors are unintentional.  Halina Maidens, MD [facility]  01/27/2019

## 2019-01-27 NOTE — Telephone Encounter (Signed)
-----   Message from Glean Hess, MD sent at 01/27/2019 11:42 AM EDT ----- Please let patient know that he has moderate blood in his urine again.  I strongly recommend that he follow up with Urology to have the testing that they planned last year.

## 2019-01-27 NOTE — Telephone Encounter (Signed)
Called and spoke with patient and informed of moderate blood in urine. He agreed to see urology again but wants new referral placed for this.  Please advise.

## 2019-01-28 LAB — COMPREHENSIVE METABOLIC PANEL
ALT: 24 IU/L (ref 0–44)
AST: 21 IU/L (ref 0–40)
Albumin/Globulin Ratio: 1.8 (ref 1.2–2.2)
Albumin: 4.5 g/dL (ref 4.0–5.0)
Alkaline Phosphatase: 56 IU/L (ref 39–117)
BUN/Creatinine Ratio: 14 (ref 9–20)
BUN: 13 mg/dL (ref 6–24)
Bilirubin Total: 0.4 mg/dL (ref 0.0–1.2)
CO2: 27 mmol/L (ref 20–29)
Calcium: 9.3 mg/dL (ref 8.7–10.2)
Chloride: 100 mmol/L (ref 96–106)
Creatinine, Ser: 0.92 mg/dL (ref 0.76–1.27)
GFR calc Af Amer: 120 mL/min/{1.73_m2} (ref >59)
GFR calc non Af Amer: 104 mL/min/{1.73_m2} (ref >59)
Globulin, Total: 2.5 g/dL (ref 1.5–4.5)
Glucose: 80 mg/dL (ref 65–99)
Potassium: 4.4 mmol/L (ref 3.5–5.2)
Sodium: 142 mmol/L (ref 134–144)
Total Protein: 7 g/dL (ref 6.0–8.5)

## 2019-01-28 LAB — CBC WITH DIFFERENTIAL/PLATELET
Basophils Absolute: 0.1 10*3/uL (ref 0.0–0.2)
Basos: 1 %
EOS (ABSOLUTE): 0.3 10*3/uL (ref 0.0–0.4)
Eos: 4 %
Hematocrit: 42.1 % (ref 37.5–51.0)
Hemoglobin: 14 g/dL (ref 13.0–17.7)
Immature Grans (Abs): 0 10*3/uL (ref 0.0–0.1)
Immature Granulocytes: 0 %
Lymphocytes Absolute: 2.1 10*3/uL (ref 0.7–3.1)
Lymphs: 26 %
MCH: 28.1 pg (ref 26.6–33.0)
MCHC: 33.3 g/dL (ref 31.5–35.7)
MCV: 84 fL (ref 79–97)
Monocytes Absolute: 0.6 10*3/uL (ref 0.1–0.9)
Monocytes: 8 %
Neutrophils Absolute: 4.9 10*3/uL (ref 1.4–7.0)
Neutrophils: 61 %
Platelets: 297 10*3/uL (ref 150–450)
RBC: 4.99 x10E6/uL (ref 4.14–5.80)
RDW: 13.2 % (ref 11.6–15.4)
WBC: 8 10*3/uL (ref 3.4–10.8)

## 2019-01-28 LAB — LIPID PANEL
Chol/HDL Ratio: 3.6 ratio (ref 0.0–5.0)
Cholesterol, Total: 152 mg/dL (ref 100–199)
HDL: 42 mg/dL (ref >39)
LDL Calculated: 99 mg/dL (ref 0–99)
Triglycerides: 55 mg/dL (ref 0–149)
VLDL Cholesterol Cal: 11 mg/dL (ref 5–40)

## 2019-01-28 LAB — HEMOGLOBIN A1C
Est. average glucose Bld gHb Est-mCnc: 100 mg/dL
Hgb A1c MFr Bld: 5.1 % (ref 4.8–5.6)

## 2019-03-19 ENCOUNTER — Ambulatory Visit: Payer: BC Managed Care – PPO | Admitting: Urology

## 2019-03-23 ENCOUNTER — Ambulatory Visit (INDEPENDENT_AMBULATORY_CARE_PROVIDER_SITE_OTHER): Payer: BC Managed Care – PPO | Admitting: Urology

## 2019-03-23 ENCOUNTER — Other Ambulatory Visit: Payer: Self-pay

## 2019-03-23 ENCOUNTER — Encounter: Payer: Self-pay | Admitting: Urology

## 2019-03-23 VITALS — BP 135/81 | HR 88 | Ht 72.0 in | Wt 250.0 lb

## 2019-03-23 DIAGNOSIS — R3129 Other microscopic hematuria: Secondary | ICD-10-CM

## 2019-03-23 LAB — URINALYSIS, COMPLETE
Bilirubin, UA: NEGATIVE
Glucose, UA: NEGATIVE
Ketones, UA: NEGATIVE
Leukocytes,UA: NEGATIVE
Nitrite, UA: NEGATIVE
Protein,UA: NEGATIVE
Specific Gravity, UA: 1.015 (ref 1.005–1.030)
Urobilinogen, Ur: 0.2 mg/dL (ref 0.2–1.0)
pH, UA: 6.5 (ref 5.0–7.5)

## 2019-03-23 LAB — MICROSCOPIC EXAMINATION
Bacteria, UA: NONE SEEN
Epithelial Cells (non renal): NONE SEEN /hpf (ref 0–10)
WBC, UA: NONE SEEN /hpf (ref 0–5)

## 2019-03-23 NOTE — Progress Notes (Signed)
   03/23/2019 2:18 PM   Max Russell Sep 12, 1980 [id number]  Reason for visit: Follow up microscopic hematuria  HPI: I saw Max Russell today in follow-up of microscopic hematuria.  He is a 38 year old healthy male with history of reported microscopic hematuria that was previously seen by Dr. Erlene Russell in June 2019.  She had recommended a renal ultrasound and cystoscopy at that time, however the patient did not follow-up.  He was re-referred from his PCP again today for " large" RBCs on urinalysis 01/27/2019.  There is no history that I am able to find of documented objective microscopic hematuria.  He does have 2 prior urinalyses with " large" RBCs on 12/12/2016, and 01/27/2019, as well as 2 urinalyses with " trace" RBCs on 01/31/2018, and 12/18/2017.  His urinalysis today is benign with 0 RBCs.  He denies any history of gross hematuria.  He is a never smoker.  There are no other carcinogenic exposures.  He denies any family history of urologic malignancies.   ROS: Please see flowsheet from today's date for complete review of systems.  Physical Exam: BP 135/81 (BP Location: Left Arm, Patient Position: Sitting)   Pulse 88   Ht 6' (1.829 m)   Wt 250 lb (113.4 kg) Comment: per pt  BMI 33.91 kg/m    Constitutional:  Alert and oriented, No acute distress. Respiratory: Normal respiratory effort, no increased work of breathing. GI: Abdomen is soft, nontender, nondistended, no abdominal masses Skin: No rashes, bruises or suspicious lesions. Neurologic: Grossly intact, no focal deficits, moving all 4 extremities. Psychiatric: Normal mood and affect  Laboratory Data: Reviewed, see HPI  Urinalysis today 0 WBCs, 0-2 RBCs, no bacteria, nitrite negative  Pertinent Imaging: None to review  Assessment & Plan:   In summary, the patient is a healthy 38 year old male with no risk factors for urothelial cell carcinoma who has a history of 2 prior urinalyses with " large" RBCs, and 2 with " trace"  RBCs.  He has 0-2 RBCs on urinalysis today.  There is no history of gross hematuria.  We a long conversation today about possible etiologies of microscopic hematuria, and the newly updated microscopic hematuria guidelines from the AUA.  We discussed the risk categories of low, intermediate, and high risk, and stratification of these patients with either repeat urinalysis, cystoscopy and renal ultrasound, or cystoscopy and CT urogram.  He falls into the low risk category, and has never had objective microscopic hematuria on urinalysis.  -Renal ultrasound, will call with results -Patient would like to defer cystoscopy at this time which is very reasonable -RTC 1 year with repeat urinalysis-> would consider cystoscopy again if microscopic hematuria 11-25 RBCs or new gross hematuria  A total of 15 minutes were spent face-to-face with the patient, greater than 50% was spent in patient education, counseling, and coordination of care regarding microscopic hematuria and options for evaluation.   Billey Co, [facility] [address], [place],  [address] [phone]

## 2019-03-23 NOTE — Patient Instructions (Signed)
Please visit [email or web address] to review the new guidelines for microscopic hematuria. If you type "microscopic hematuria" into the search box the full guideline and new treatment algorithm can be seen.  Call if any questions or blood in the urine that you can see.  Will call with kidney ultrasound results.

## 2019-05-12 ENCOUNTER — Ambulatory Visit
Admission: RE | Admit: 2019-05-12 | Discharge: 2019-05-12 | Disposition: A | Discharge status: Home / Self Care | Payer: BC Managed Care – PPO | Source: Ambulatory Visit | Attending: Urology | Admitting: Urology

## 2019-05-12 ENCOUNTER — Other Ambulatory Visit: Payer: Self-pay

## 2019-05-12 DIAGNOSIS — R3129 Other microscopic hematuria: Secondary | ICD-10-CM | POA: Insufficient documentation

## 2019-05-13 ENCOUNTER — Telehealth: Payer: Self-pay | Admitting: *Deleted

## 2019-05-13 NOTE — Telephone Encounter (Addendum)
Patient informed-verbalized understanding  ----- Message from Billey Co, MD sent at 05/13/2019  4:36 PM EDT ----- Renal ultrasound normal, follow up as scheduled  Nickolas Madrid, MD 05/13/2019

## 2019-09-17 DIAGNOSIS — B356 Tinea cruris: Secondary | ICD-10-CM | POA: Diagnosis not present

## 2019-09-17 DIAGNOSIS — L578 Other skin changes due to chronic exposure to nonionizing radiation: Secondary | ICD-10-CM | POA: Diagnosis not present

## 2019-09-17 DIAGNOSIS — Z86018 Personal history of other benign neoplasm: Secondary | ICD-10-CM | POA: Diagnosis not present

## 2019-09-17 DIAGNOSIS — L280 Lichen simplex chronicus: Secondary | ICD-10-CM | POA: Diagnosis not present

## 2019-09-23 ENCOUNTER — Telehealth: Payer: Self-pay

## 2019-09-23 NOTE — Telephone Encounter (Signed)
Patient said that in the past 2019 and 2018 he had episodes of chest pain, lightheadedness, cold waves over his chest and cold hands.   We did EKG in the past and it was normal. He stated he had another episode of this last night. Feels fine now. Started two medication yesterday for jock itch from dermatology. Impoyz and Diflucan ( which he has taken once before from [place] )  Wants to know if this could cause the symtoms?  What should he do since he had another episode or should we sent to a specialist since this keeps happening.  Please advise.

## 2019-09-23 NOTE — Telephone Encounter (Signed)
Spoke with patient and he was informed.

## 2019-09-23 NOTE — Telephone Encounter (Signed)
If the episodes are brief and only occurring every 6 months, then I would not recommend any evaluation.  If the episodes become more frequent, then will need evaluation here to decide the next steps.  I do not believe that the medication is the cause.

## 2019-09-24 ENCOUNTER — Telehealth: Payer: Self-pay | Admitting: Internal Medicine

## 2019-09-24 NOTE — Telephone Encounter (Signed)
Pt having side effect to a medication given by his dermatologist, was told to contact his pcp. Pt started feeling tired, weak, has some sore spots on tongue a couple of days after starting meds.

## 2019-09-24 NOTE — Telephone Encounter (Signed)
Can you call him and schedule an appt tomorrow afternoon at [facility]?. I spoke with the pt yesterday about this. Dr Army Melia does not think these are reactions to his new meds. We will need to evaluate in person. Thank you.

## 2019-09-25 ENCOUNTER — Ambulatory Visit (INDEPENDENT_AMBULATORY_CARE_PROVIDER_SITE_OTHER): Payer: BC Managed Care – PPO | Admitting: Internal Medicine

## 2019-09-25 ENCOUNTER — Other Ambulatory Visit: Payer: Self-pay

## 2019-09-25 ENCOUNTER — Encounter: Payer: Self-pay | Admitting: Internal Medicine

## 2019-09-25 VITALS — BP 124/70 | HR 99 | Temp 98.9°F | Ht 72.0 in | Wt 262.0 lb

## 2019-09-25 DIAGNOSIS — R10811 Right upper quadrant abdominal tenderness: Secondary | ICD-10-CM

## 2019-09-25 DIAGNOSIS — B356 Tinea cruris: Secondary | ICD-10-CM | POA: Diagnosis not present

## 2019-09-25 NOTE — Progress Notes (Signed)
Date:  09/25/2019   Name:  Max Russell   DOB:  12/27/1980   MRN:  [id number]   Chief Complaint: Fatigue (Started on 2 new meds from dermatology and 3 days after he noticed weak feelings, fatigue, cold sweat wave spell across chest, and hands were cold. Today noticed he had yellow light colored stool. )  Fatigue - he took 5 days of diflucan and on the second day started to have fatigue, sweats without fever, cold hand and yesterday and today pale yellow formed stools.  He denies fever, cough, SOB, change in taste or smell.  No covid exposures.  No sore throat.  Maybe mild abdominal discomfort but no diarrhea or vomiting.  He has not taken diflucan for the past 2 days.  Lab Results  Component Value Date   CREATININE 0.92 01/27/2019   BUN 13 01/27/2019   NA 142 01/27/2019   K 4.4 01/27/2019   CL 100 01/27/2019   CO2 27 01/27/2019   Lab Results  Component Value Date   CHOL 152 01/27/2019   HDL 42 01/27/2019   LDLCALC 99 01/27/2019   TRIG 55 01/27/2019   CHOLHDL 3.6 01/27/2019   Lab Results  Component Value Date   TSH 0.818 12/18/2017   Lab Results  Component Value Date   HGBA1C 5.1 01/27/2019     Review of Systems  Constitutional: Positive for diaphoresis and fatigue. Negative for chills, fever and unexpected weight change.  HENT: Negative for congestion, sore throat and trouble swallowing.   Respiratory: Negative for cough, chest tightness and shortness of breath.   Cardiovascular: Negative for chest pain, palpitations and leg swelling.  Gastrointestinal: Positive for abdominal pain. Negative for blood in stool, constipation, diarrhea, nausea and vomiting.  Genitourinary: Negative for decreased urine volume and dysuria.  Musculoskeletal: Positive for back pain (chronic). Negative for arthralgias.  Neurological: Negative for dizziness, light-headedness and headaches.    Patient Active Problem List   Diagnosis Date Noted  . Mild hyperlipidemia 01/27/2019  .  Microscopic hematuria 01/27/2019  . Ulnar tunnel syndrome of left wrist 12/12/2016  . Testicular abnormality 06/04/2016  . Hx of dysplastic nevus 12/07/2015  . Long term use of drug 12/07/2015  . Abdominal lipoma 07/03/2015  . Facet syndrome, lumbar 07/03/2015    No Known Allergies  Past Surgical History:  Procedure Laterality Date  . dyplastic nevus     x 4    Social History   Tobacco Use  . Smoking status: Never Smoker  . Smokeless tobacco: Never Used  Substance Use Topics  . Alcohol use: Yes    Alcohol/week: 4.0 standard drinks    Types: 4 Standard drinks or equivalent per week  . Drug use: No     Medication list has been reviewed and updated.  Current Meds  Medication Sig  . naproxen sodium (ALEVE) 220 MG tablet Take 1 tablet by mouth 2 (two) times daily as needed.  . Omega-3 Fatty Acids (FISH OIL) 1000 MG CAPS Take 1 capsule by mouth.    PHQ 2/9 Scores 09/25/2019 01/27/2019 07/30/2017  PHQ - 2 Score 0 0 0  PHQ- 9 Score - 4 -    BP Readings from Last 3 Encounters:  09/25/19 124/70  03/23/19 135/81  01/27/19 120/80    Physical Exam Vitals and nursing note reviewed.  Constitutional:      General: He is not in acute distress.    Appearance: He is well-developed.  HENT:     Head: Normocephalic and  atraumatic.     Right Ear: Tympanic membrane and ear canal normal.     Left Ear: Tympanic membrane and ear canal normal.  Neck:     Vascular: No carotid bruit.  Cardiovascular:     Rate and Rhythm: Normal rate and regular rhythm.     Heart sounds: Normal heart sounds.  Pulmonary:     Effort: Pulmonary effort is normal. No respiratory distress.     Breath sounds: Normal breath sounds. No decreased breath sounds or wheezing.  Abdominal:     General: Abdomen is flat. Bowel sounds are decreased.     Palpations: Abdomen is soft.     Tenderness: There is abdominal tenderness in the right upper quadrant. There is no right CVA tenderness, left CVA tenderness,  guarding or rebound.  Musculoskeletal:        General: Normal range of motion.     Cervical back: Normal range of motion.     Right lower leg: No edema.     Left lower leg: No edema.  Lymphadenopathy:     Cervical: No cervical adenopathy.  Skin:    General: Skin is warm and dry.     Coloration: Skin is not jaundiced.     Findings: No rash.  Neurological:     Mental Status: He is alert and oriented to person, place, and time.  Psychiatric:        Attention and Perception: Attention normal.        Mood and Affect: Mood normal.        Behavior: Behavior normal.        Thought Content: Thought content normal.     Wt Readings from Last 3 Encounters:  09/25/19 262 lb (118.8 kg)  03/23/19 250 lb (113.4 kg)  01/27/19 253 lb (114.8 kg)    BP 124/70   Pulse 99   Temp 98.9 F (37.2 C) (Oral)   Ht 6' (1.829 m)   Wt 262 lb (118.8 kg)   SpO2 98%   BMI 35.53 kg/m   Assessment and Plan: 1. Right upper quadrant abdominal tenderness without rebound tenderness Concerning for hepatic or gall bladder disease Will get labs, recommend increased fluids, rest and bland diet No further diflucan - Comprehensive metabolic panel - CBC with Differential/Platelet  2. Tinea cruris May continue topical medication if needed as prescribed by Dermatology   Partially dictated using Dragon software. Any errors are unintentional.  Halina Maidens, MD [facility]  09/25/2019

## 2019-09-26 LAB — COMPREHENSIVE METABOLIC PANEL
ALT: 29 IU/L (ref 0–44)
AST: 23 IU/L (ref 0–40)
Albumin/Globulin Ratio: 1.5 (ref 1.2–2.2)
Albumin: 4.5 g/dL (ref 4.0–5.0)
Alkaline Phosphatase: 69 IU/L (ref 39–117)
BUN/Creatinine Ratio: 16 (ref 9–20)
BUN: 12 mg/dL (ref 6–24)
Bilirubin Total: 0.3 mg/dL (ref 0.0–1.2)
CO2: 27 mmol/L (ref 20–29)
Calcium: 10.1 mg/dL (ref 8.7–10.2)
Chloride: 101 mmol/L (ref 96–106)
Creatinine, Ser: 0.75 mg/dL — ABNORMAL LOW (ref 0.76–1.27)
GFR calc Af Amer: 133 mL/min/{1.73_m2} (ref >59)
GFR calc non Af Amer: 115 mL/min/{1.73_m2} (ref >59)
Globulin, Total: 3 g/dL (ref 1.5–4.5)
Glucose: 85 mg/dL (ref 65–99)
Potassium: 6 mmol/L — ABNORMAL HIGH (ref 3.5–5.2)
Sodium: 140 mmol/L (ref 134–144)
Total Protein: 7.5 g/dL (ref 6.0–8.5)

## 2019-09-26 LAB — CBC WITH DIFFERENTIAL/PLATELET
Basophils Absolute: 0.1 10*3/uL (ref 0.0–0.2)
Basos: 1 %
EOS (ABSOLUTE): 0.1 10*3/uL (ref 0.0–0.4)
Eos: 2 %
Hematocrit: 44.6 % (ref 37.5–51.0)
Hemoglobin: 14.9 g/dL (ref 13.0–17.7)
Immature Grans (Abs): 0 10*3/uL (ref 0.0–0.1)
Immature Granulocytes: 0 %
Lymphocytes Absolute: 2.4 10*3/uL (ref 0.7–3.1)
Lymphs: 25 %
MCH: 28.4 pg (ref 26.6–33.0)
MCHC: 33.4 g/dL (ref 31.5–35.7)
MCV: 85 fL (ref 79–97)
Monocytes Absolute: 0.6 10*3/uL (ref 0.1–0.9)
Monocytes: 7 %
Neutrophils Absolute: 6.3 10*3/uL (ref 1.4–7.0)
Neutrophils: 65 %
Platelets: 398 10*3/uL (ref 150–450)
RBC: 5.25 x10E6/uL (ref 4.14–5.80)
RDW: 12.5 % (ref 11.6–15.4)
WBC: 9.5 10*3/uL (ref 3.4–10.8)

## 2019-10-01 ENCOUNTER — Other Ambulatory Visit: Payer: Self-pay | Admitting: Internal Medicine

## 2019-10-01 DIAGNOSIS — E875 Hyperkalemia: Secondary | ICD-10-CM

## 2019-10-02 ENCOUNTER — Other Ambulatory Visit
Admission: RE | Admit: 2019-10-02 | Discharge: 2019-10-02 | Disposition: A | Discharge status: Home / Self Care | Payer: BC Managed Care – PPO | Attending: Internal Medicine | Admitting: Internal Medicine

## 2019-10-02 ENCOUNTER — Other Ambulatory Visit: Payer: Self-pay

## 2019-10-02 ENCOUNTER — Other Ambulatory Visit: Payer: BC Managed Care – PPO

## 2019-10-02 DIAGNOSIS — E875 Hyperkalemia: Secondary | ICD-10-CM | POA: Insufficient documentation

## 2019-10-02 LAB — POTASSIUM: Potassium: 4.9 mmol/L (ref 3.5–5.1)

## 2019-10-25 IMAGING — US US RENAL
1 series · 14 of 25 positions shown · non-contrast
Comparison: None.

CLINICAL DATA: Microhematuria

EXAM:
RENAL / URINARY TRACT ULTRASOUND COMPLETE

[Series 1: us renal · 0.27mm/px · 14 of 28 slices shown]
[im 1/28]
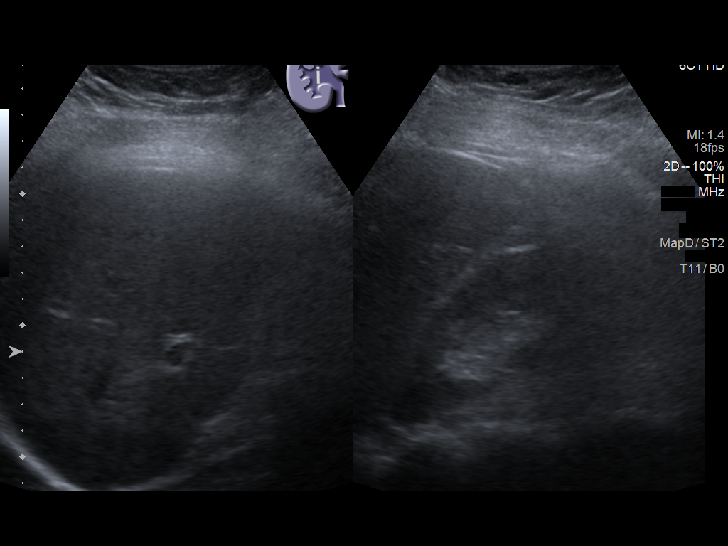
[im 3/28]
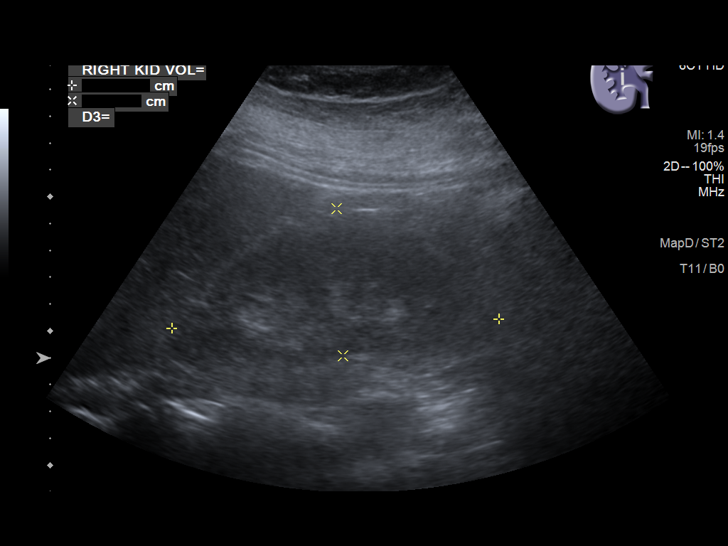
[im 5/28]
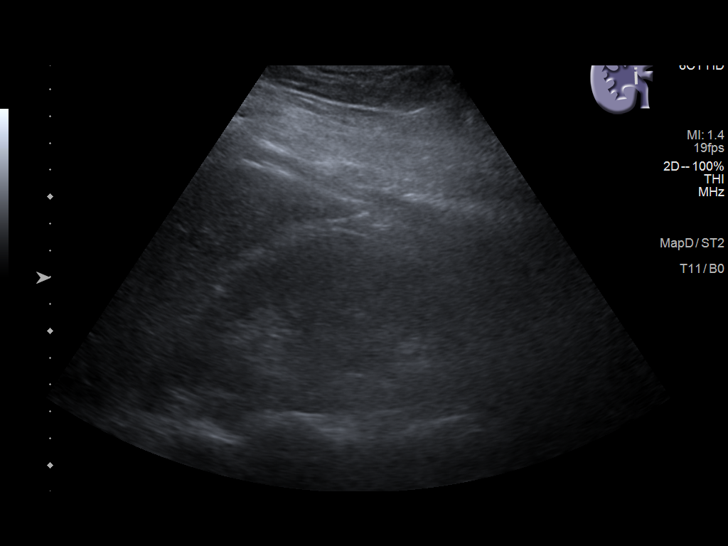
[im 7/28]
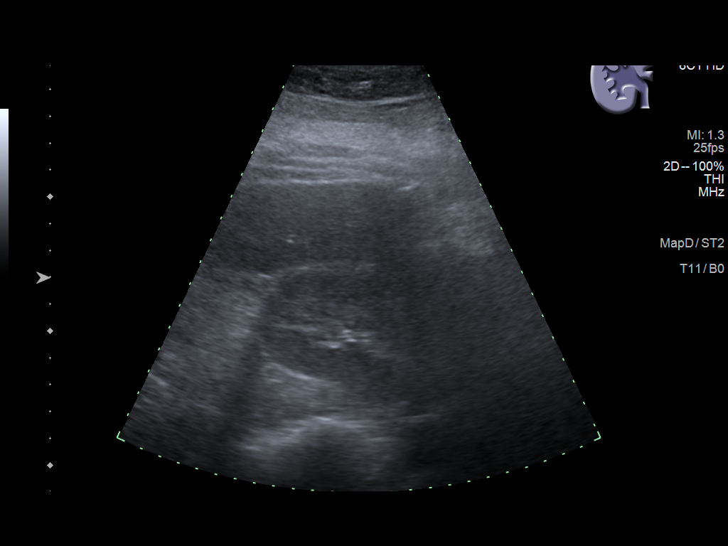
[im 10/28]
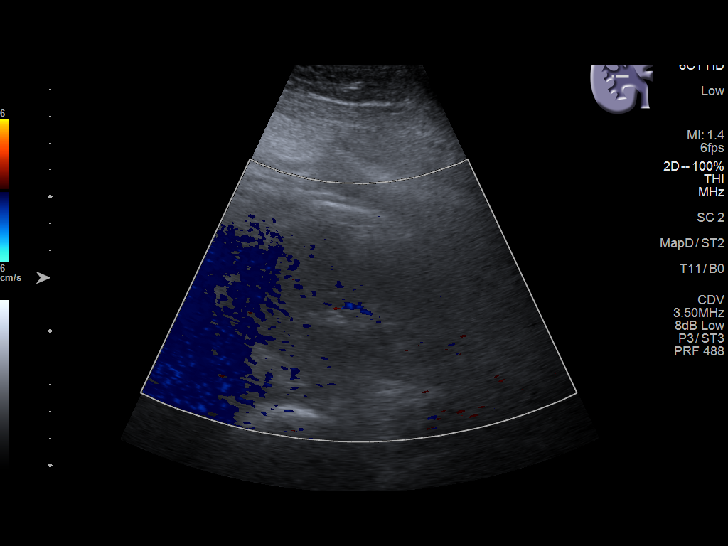
[im 11/28]
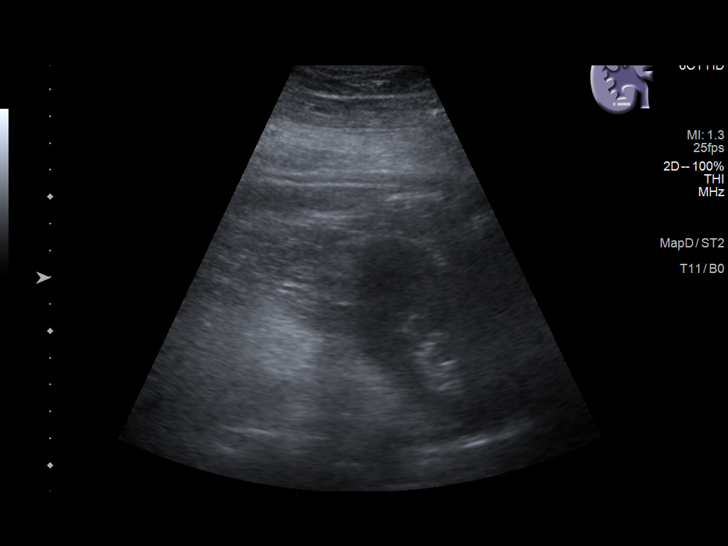
[im 13/28]
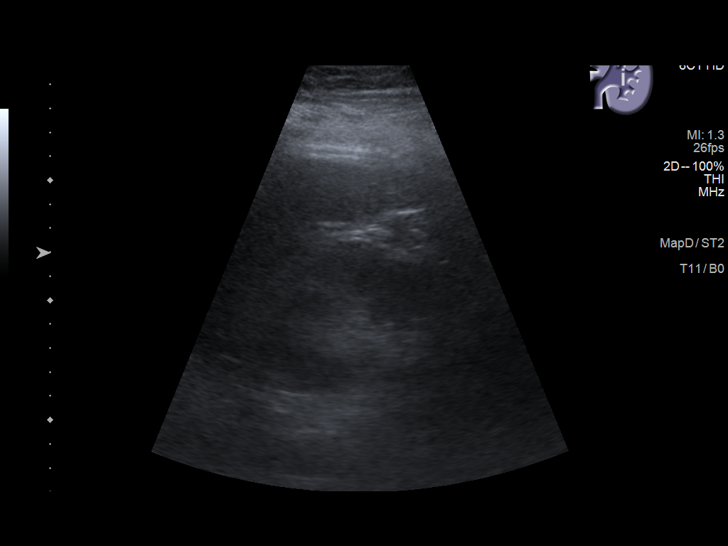
[im 15/28]
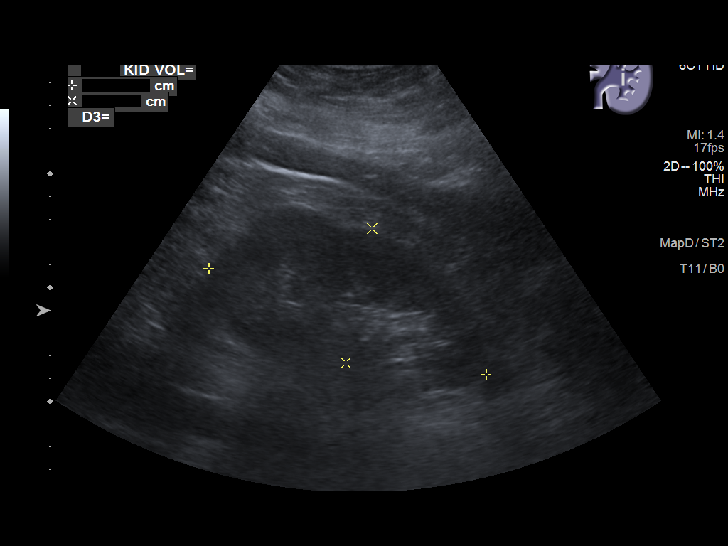
[im 17/28]
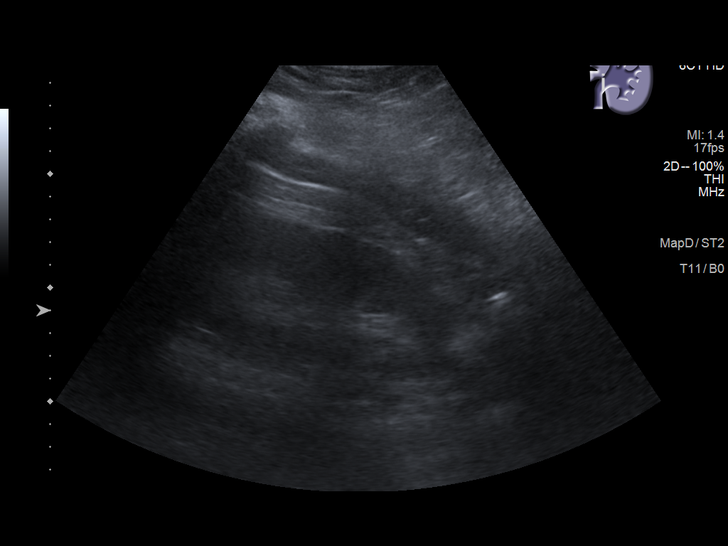
[im 19/28]
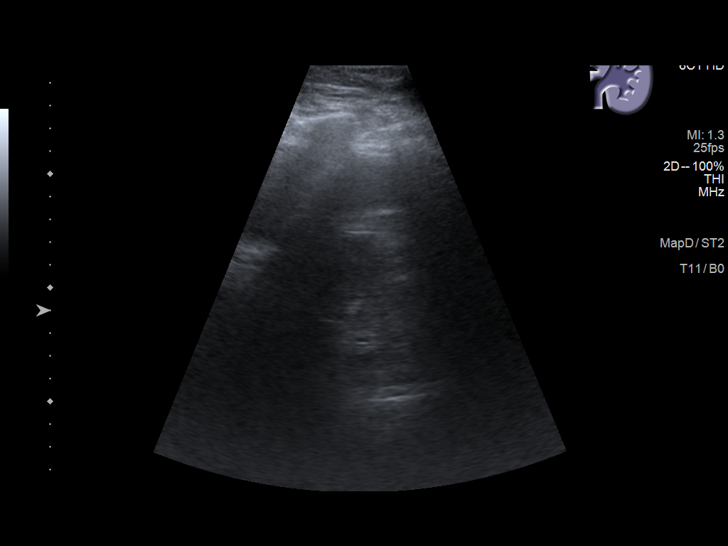
[im 21/28]
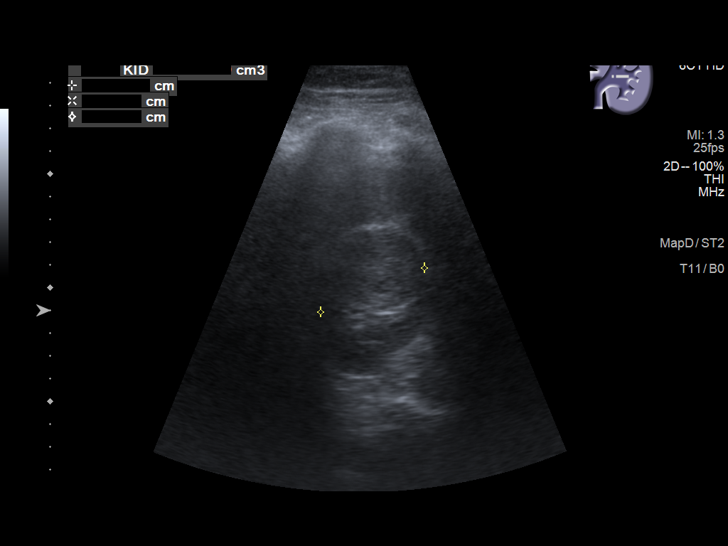
[im 23/28]
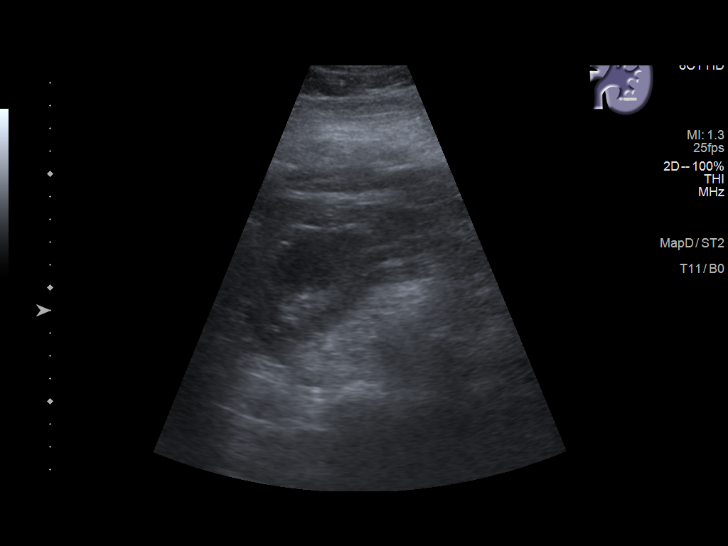
[im 25/28]
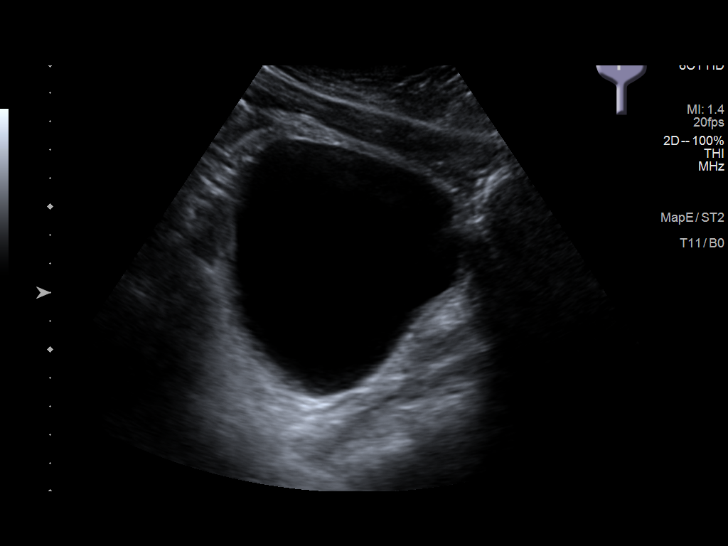
[im 28/28]
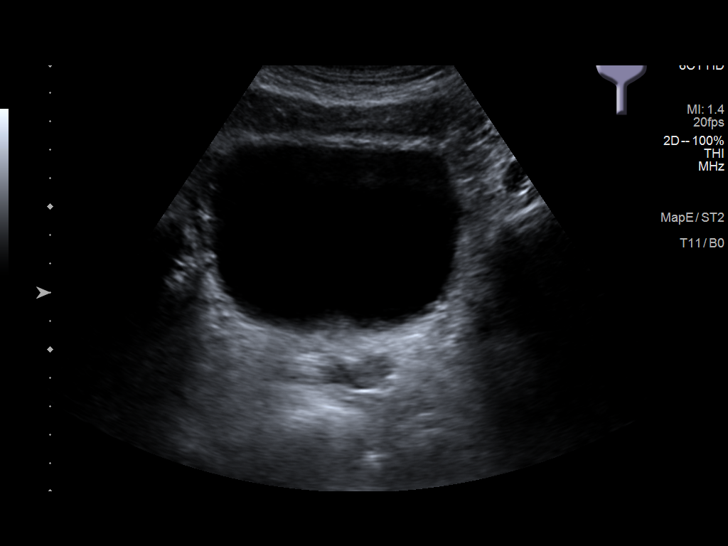

[14 of 25 positions shown; findings below may reference images not displayed]

FINDINGS: Right Kidney:

Renal measurements: 12.2 x 5.5 x 5.4 cm = volume: 188.9 mL .
Echogenicity within normal limits. No mass or hydronephrosis
visualized.

Left Kidney:

Renal measurements: 13 x 6 x 5 cm = volume: 203.9 mL. Echogenicity
within normal limits. No mass or hydronephrosis visualized.

Bladder:

Appears normal for degree of bladder distention.
IMPRESSION: Negative renal ultrasound

## 2019-10-28 ENCOUNTER — Other Ambulatory Visit: Payer: Self-pay | Admitting: Internal Medicine

## 2019-10-28 DIAGNOSIS — B354 Tinea corporis: Secondary | ICD-10-CM

## 2019-12-04 ENCOUNTER — Other Ambulatory Visit: Payer: Self-pay

## 2019-12-04 ENCOUNTER — Ambulatory Visit: Payer: Self-pay | Attending: Internal Medicine

## 2019-12-04 DIAGNOSIS — Z23 Encounter for immunization: Secondary | ICD-10-CM

## 2019-12-04 NOTE — Progress Notes (Signed)
   Covid-19 Vaccination Clinic  Name:  Cyrille Shams    MRN: [id number] DOB: 1981-04-30  12/04/2019  Mr. Ormiston was observed post Covid-19 immunization for 15 minutes without incident. He was provided with Vaccine Information Sheet and instruction to access the V-Safe system.   Mr. Poppleton was instructed to call 911 with any severe reactions post vaccine: Marland Kitchen Difficulty breathing  . Swelling of face and throat  . A fast heartbeat  . A bad rash all over body  . Dizziness and weakness   Immunizations Administered    Name Date Dose VIS Date Route   Pfizer COVID-19 Vaccine 12/04/2019 10:31 AM 0.3 mL 08/07/2019 Intramuscular   Manufacturer: [place]   Lot: [id number]   [place]: [id number]

## 2019-12-30 ENCOUNTER — Ambulatory Visit: Payer: Self-pay | Attending: Internal Medicine

## 2019-12-30 DIAGNOSIS — Z23 Encounter for immunization: Secondary | ICD-10-CM

## 2019-12-30 NOTE — Progress Notes (Signed)
   Covid-19 Vaccination Clinic  Name:  Max Russell    MRN: [id number] DOB: 08-05-1981  12/30/2019  [name] was observed post Covid-19 immunization for 15 minutes without incident. He was provided with Vaccine Information Sheet and instruction to access the V-Safe system.   [name] was instructed to call 911 with any severe reactions post vaccine: Marland Kitchen Difficulty breathing  . Swelling of face and throat  . A fast heartbeat  . A bad rash all over body  . Dizziness and weakness   Immunizations Administered    Name Date Dose VIS Date Route   Pfizer COVID-19 Vaccine 12/30/2019 10:14 AM 0.3 mL 10/21/2018 Intramuscular   Manufacturer: [employer]   Lot: [id number]   [place]: [id number]

## 2020-01-28 ENCOUNTER — Other Ambulatory Visit: Payer: Self-pay

## 2020-01-28 ENCOUNTER — Encounter: Payer: Self-pay | Admitting: Internal Medicine

## 2020-01-28 ENCOUNTER — Ambulatory Visit (INDEPENDENT_AMBULATORY_CARE_PROVIDER_SITE_OTHER): Payer: BC Managed Care – PPO | Admitting: Internal Medicine

## 2020-01-28 VITALS — BP 106/64 | HR 78 | Temp 97.9°F | Ht 72.0 in | Wt 257.0 lb

## 2020-01-28 DIAGNOSIS — Z79899 Other long term (current) drug therapy: Secondary | ICD-10-CM

## 2020-01-28 DIAGNOSIS — E785 Hyperlipidemia, unspecified: Secondary | ICD-10-CM | POA: Diagnosis not present

## 2020-01-28 DIAGNOSIS — R3129 Other microscopic hematuria: Secondary | ICD-10-CM | POA: Diagnosis not present

## 2020-01-28 DIAGNOSIS — Z0000 Encounter for general adult medical examination without abnormal findings: Secondary | ICD-10-CM | POA: Diagnosis not present

## 2020-01-28 DIAGNOSIS — N509 Disorder of male genital organs, unspecified: Secondary | ICD-10-CM

## 2020-01-28 LAB — POCT URINALYSIS DIPSTICK
Bilirubin, UA: NEGATIVE
Glucose, UA: NEGATIVE
Ketones, UA: NEGATIVE
Leukocytes, UA: NEGATIVE
Nitrite, UA: NEGATIVE
Protein, UA: NEGATIVE
Spec Grav, UA: 1.01 (ref 1.010–1.025)
Urobilinogen, UA: 0.2 E.U./dL
pH, UA: 5 (ref 5.0–8.0)

## 2020-01-28 NOTE — Progress Notes (Signed)
Date:  01/28/2020   Name:  Max Russell   DOB:  07-12-1981   MRN:  [id number]   Chief Complaint: Annual Exam Max Russell is a 39 y.o. male who presents today for his Complete Annual Exam. He feels fairly well. He reports exercising walking 20 mi/week. He reports he is sleeping fairly well.   Immunization History  Administered Date(s) Administered  . Influenza,inj,Quad PF,6+ Mos 07/30/2017  . Influenza-Unspecified 06/27/2018  . PFIZER SARS-COV-2 Vaccination 12/04/2019, 12/30/2019  . Tdap 01/27/2019    HPI  Lab Results  Component Value Date   CREATININE 0.75 (L) 09/25/2019   BUN 12 09/25/2019   NA 140 09/25/2019   K 4.9 10/02/2019   CL 101 09/25/2019   CO2 27 09/25/2019   Lab Results  Component Value Date   CHOL 152 01/27/2019   HDL 42 01/27/2019   LDLCALC 99 01/27/2019   TRIG 55 01/27/2019   CHOLHDL 3.6 01/27/2019   Lab Results  Component Value Date   TSH 0.818 12/18/2017   Lab Results  Component Value Date   HGBA1C 5.1 01/27/2019   Lab Results  Component Value Date   WBC 9.5 09/25/2019   HGB 14.9 09/25/2019   HCT 44.6 09/25/2019   MCV 85 09/25/2019   PLT 398 09/25/2019   Lab Results  Component Value Date   ALT 29 09/25/2019   AST 23 09/25/2019   ALKPHOS 69 09/25/2019   BILITOT 0.3 09/25/2019     Review of Systems  Constitutional: Negative for appetite change, chills, diaphoresis, fatigue and unexpected weight change.  HENT: Negative for hearing loss, tinnitus, trouble swallowing and voice change.   Eyes: Negative for visual disturbance.  Respiratory: Negative for choking, shortness of breath and wheezing.   Cardiovascular: Negative for chest pain, palpitations and leg swelling.  Gastrointestinal: Negative for abdominal pain, blood in stool, constipation and diarrhea.  Genitourinary: Positive for testicular pain (previous testicular issues of "free floating" continues without pain or swelling.  Seen by Urology and reasured). Negative  for difficulty urinating, dysuria, frequency and hematuria.  Musculoskeletal: Positive for back pain. Negative for arthralgias and myalgias.  Skin: Positive for rash (recurrent mild tinea cruris). Negative for color change.  Allergic/Immunologic: Positive for environmental allergies.  Neurological: Negative for dizziness, syncope and headaches.  Hematological: Negative for adenopathy.  Psychiatric/Behavioral: Negative for dysphoric mood and sleep disturbance. The patient is not nervous/anxious.     Patient Active Problem List   Diagnosis Date Noted  . Mild hyperlipidemia 01/27/2019  . Microscopic hematuria 01/27/2019  . Ulnar tunnel syndrome of left wrist 12/12/2016  . Hx of dysplastic nevus 12/07/2015  . Long term use of drug 12/07/2015  . Abdominal lipoma 07/03/2015  . Facet syndrome, lumbar 07/03/2015    No Known Allergies  Past Surgical History:  Procedure Laterality Date  . dyplastic nevus     x 4    Social History   Tobacco Use  . Smoking status: Never Smoker  . Smokeless tobacco: Never Used  Substance Use Topics  . Alcohol use: Yes    Alcohol/week: 4.0 standard drinks    Types: 4 Standard drinks or equivalent per week  . Drug use: No     Medication list has been reviewed and updated.  Current Meds  Medication Sig  . naproxen sodium (ALEVE) 220 MG tablet Take 1 tablet by mouth 2 (two) times daily as needed.  . nystatin-triamcinolone ointment (MYCOLOG) Apply 1 application topically 2 (two) times daily. PRN  .  Omega-3 Fatty Acids (FISH OIL) 1000 MG CAPS Take 1 capsule by mouth.    PHQ 2/9 Scores 01/28/2020 09/25/2019 01/27/2019 07/30/2017  PHQ - 2 Score 2 0 0 0  PHQ- 9 Score 2 - 4 -    BP Readings from Last 3 Encounters:  01/28/20 106/64  09/25/19 124/70  03/23/19 135/81    Physical Exam Vitals and nursing note reviewed.  Constitutional:      Appearance: Normal appearance. He is well-developed.  HENT:     Head: Normocephalic.     Right Ear: Tympanic  membrane, ear canal and external ear normal.     Left Ear: Tympanic membrane, ear canal and external ear normal.     Nose: Nose normal.     Mouth/Throat:     Pharynx: Uvula midline.  Eyes:     Conjunctiva/sclera: Conjunctivae normal.     Pupils: Pupils are equal, round, and reactive to light.  Neck:     Thyroid: No thyromegaly.     Vascular: No carotid bruit.  Cardiovascular:     Rate and Rhythm: Normal rate and regular rhythm.     Pulses: Normal pulses.     Heart sounds: Normal heart sounds. No murmur.  Pulmonary:     Effort: Pulmonary effort is normal.     Breath sounds: Normal breath sounds. No wheezing.  Chest:     Breasts:        Right: No mass.        Left: No mass.  Abdominal:     General: Bowel sounds are normal.     Palpations: Abdomen is soft.     Tenderness: There is no abdominal tenderness.  Musculoskeletal:        General: No tenderness or deformity. Normal range of motion.     Cervical back: Normal range of motion and neck supple.     Right lower leg: No edema.     Left lower leg: No edema.  Lymphadenopathy:     Cervical: No cervical adenopathy.  Skin:    General: Skin is warm and dry.     Capillary Refill: Capillary refill takes less than 2 seconds.     Comments: Mild eczema of external ear canals Multiple benign appearing nevi  Neurological:     General: No focal deficit present.     Mental Status: He is alert and oriented to person, place, and time.     Deep Tendon Reflexes: Reflexes are normal and symmetric.  Psychiatric:        Mood and Affect: Mood normal.        Speech: Speech normal.        Behavior: Behavior normal.        Thought Content: Thought content normal.     Wt Readings from Last 3 Encounters:  01/28/20 257 lb (116.6 kg)  09/25/19 262 lb (118.8 kg)  03/23/19 250 lb (113.4 kg)    BP 106/64   Pulse 78   Temp 97.9 F (36.6 C) (Oral)   Ht 6' (1.829 m)   Wt 257 lb (116.6 kg)   SpO2 100%   BMI 34.86 kg/m   Assessment and  Plan: 1. Annual physical exam Normal exam except for weight Work on healthy diet, regular exercise  2. Microscopic hematuria Worked up in the past - negative - POCT urinalysis dipstick  3. Mild hyperlipidemia Continue low fat diet No medication is indicated at this time - Lipid panel  4. Long term use of drug Taking Aleve nightly  for low back pain - CBC with Differential/Platelet - Comprehensive metabolic panel  5. Testicular abnormality Abnormality persists but without pain or swelling Pt has been educated regarding torsion and the need for emergent medical attention in the event it occurs   Partially dictated using Editor, commissioning. Any errors are unintentional.  Halina Maidens, MD [facility]  01/28/2020

## 2020-01-29 LAB — COMPREHENSIVE METABOLIC PANEL
ALT: 27 IU/L (ref 0–44)
AST: 22 IU/L (ref 0–40)
Albumin/Globulin Ratio: 1.6 (ref 1.2–2.2)
Albumin: 4.4 g/dL (ref 4.0–5.0)
Alkaline Phosphatase: 64 IU/L (ref 48–121)
BUN/Creatinine Ratio: 14 (ref 9–20)
BUN: 12 mg/dL (ref 6–24)
Bilirubin Total: 0.5 mg/dL (ref 0.0–1.2)
CO2: 27 mmol/L (ref 20–29)
Calcium: 9.5 mg/dL (ref 8.7–10.2)
Chloride: 100 mmol/L (ref 96–106)
Creatinine, Ser: 0.87 mg/dL (ref 0.76–1.27)
GFR calc Af Amer: 124 mL/min/{1.73_m2} (ref >59)
GFR calc non Af Amer: 107 mL/min/{1.73_m2} (ref >59)
Globulin, Total: 2.7 g/dL (ref 1.5–4.5)
Glucose: 87 mg/dL (ref 65–99)
Potassium: 4.5 mmol/L (ref 3.5–5.2)
Sodium: 140 mmol/L (ref 134–144)
Total Protein: 7.1 g/dL (ref 6.0–8.5)

## 2020-01-29 LAB — LIPID PANEL
Chol/HDL Ratio: 4.6 ratio (ref 0.0–5.0)
Cholesterol, Total: 161 mg/dL (ref 100–199)
HDL: 35 mg/dL — ABNORMAL LOW (ref >39)
LDL Chol Calc (NIH): 113 mg/dL — ABNORMAL HIGH (ref 0–99)
Triglycerides: 67 mg/dL (ref 0–149)
VLDL Cholesterol Cal: 13 mg/dL (ref 5–40)

## 2020-01-29 LAB — CBC WITH DIFFERENTIAL/PLATELET
Basophils Absolute: 0 10*3/uL (ref 0.0–0.2)
Basos: 0 %
EOS (ABSOLUTE): 0.2 10*3/uL (ref 0.0–0.4)
Eos: 2 %
Hematocrit: 42.2 % (ref 37.5–51.0)
Hemoglobin: 14 g/dL (ref 13.0–17.7)
Immature Grans (Abs): 0 10*3/uL (ref 0.0–0.1)
Immature Granulocytes: 0 %
Lymphocytes Absolute: 2.7 10*3/uL (ref 0.7–3.1)
Lymphs: 30 %
MCH: 27.8 pg (ref 26.6–33.0)
MCHC: 33.2 g/dL (ref 31.5–35.7)
MCV: 84 fL (ref 79–97)
Monocytes Absolute: 0.6 10*3/uL (ref 0.1–0.9)
Monocytes: 6 %
Neutrophils Absolute: 5.6 10*3/uL (ref 1.4–7.0)
Neutrophils: 62 %
Platelets: 317 10*3/uL (ref 150–450)
RBC: 5.03 x10E6/uL (ref 4.14–5.80)
RDW: 12.7 % (ref 11.6–15.4)
WBC: 9.1 10*3/uL (ref 3.4–10.8)

## 2020-03-24 ENCOUNTER — Ambulatory Visit: Payer: BC Managed Care – PPO | Admitting: Urology

## 2020-11-29 ENCOUNTER — Encounter: Payer: Self-pay | Admitting: Urology

## 2020-11-29 ENCOUNTER — Ambulatory Visit (INDEPENDENT_AMBULATORY_CARE_PROVIDER_SITE_OTHER): Payer: BC Managed Care – PPO | Admitting: Urology

## 2020-11-29 ENCOUNTER — Other Ambulatory Visit: Payer: Self-pay | Admitting: *Deleted

## 2020-11-29 ENCOUNTER — Other Ambulatory Visit
Admission: RE | Admit: 2020-11-29 | Discharge: 2020-11-29 | Disposition: A | Discharge status: Home / Self Care | Payer: BC Managed Care – PPO | Attending: Urology | Admitting: Urology

## 2020-11-29 ENCOUNTER — Other Ambulatory Visit: Payer: Self-pay

## 2020-11-29 VITALS — BP 146/74 | HR 103 | Ht 70.0 in | Wt 251.0 lb

## 2020-11-29 DIAGNOSIS — R3129 Other microscopic hematuria: Secondary | ICD-10-CM | POA: Insufficient documentation

## 2020-11-29 DIAGNOSIS — L723 Sebaceous cyst: Secondary | ICD-10-CM

## 2020-11-29 LAB — URINALYSIS, COMPLETE (UACMP) WITH MICROSCOPIC
Bilirubin Urine: NEGATIVE
Glucose, UA: NEGATIVE mg/dL
Ketones, ur: NEGATIVE mg/dL
Leukocytes,Ua: NEGATIVE
Nitrite: NEGATIVE
Protein, ur: NEGATIVE mg/dL
Specific Gravity, Urine: 1.02 (ref 1.005–1.030)
pH: 7 (ref 5.0–8.0)

## 2020-11-29 NOTE — Patient Instructions (Signed)
Epidermoid Cyst  An epidermoid cyst, also called an epidermal cyst, is a small lump under your skin. The cyst contains a substance called keratin. Do not try to pop or open the cyst yourself. What are the causes?  A blocked hair follicle.  A hair that curls and re-enters the skin instead of growing straight out of the skin.  A blocked pore.  Irritated skin.  An injury to the skin.  Certain conditions that are passed along from parent to child.  Human papillomavirus (HPV). This happens rarely when cysts occur on the bottom of the feet.  Long-term sun damage to the skin. What increases the risk?  Having acne.  Being male.  Having an injury to the skin.  Being past puberty.  Having certain conditions caused by genes (genetic disorder) What are the signs or symptoms? These cysts are usually harmless, but they can get infected. Symptoms of infection may include:  Redness.  Inflammation.  Tenderness.  Warmth.  Fever.  A bad-smelling substance that drains from the cyst.  Pus that drains from the cyst. How is this treated? In many cases, epidermoid cysts go away on their own without treatment. If a cyst becomes infected, treatment may include:  Opening and draining the cyst, done by a doctor. After draining, you may need minor surgery to remove the rest of the cyst.  Antibiotic medicine.  Shots of medicines (steroids) that help to reduce inflammation.  Surgery to remove the cyst. Surgery may be done if the cyst: ? Becomes large. ? Bothers you. ? Has a chance of turning into cancer.  Do not try to open a cyst yourself. Follow these instructions at home: Medicines  Take over-the-counter and prescription medicines as told by your doctor.  If you were prescribed an antibiotic medicine, take it as told by your doctor. Do not stop taking it even if you start to feel better. General instructions  Keep the area around your cyst clean and dry.  Wear loose, dry  clothing.  Avoid touching your cyst.  Check your cyst every day for signs of infection. Check for: ? Redness, swelling, or pain. ? Fluid or blood. ? Warmth. ? Pus or a bad smell.  Keep all follow-up visits. How is this prevented?  Wear clean, dry, clothing.  Avoid wearing tight clothing.  Keep your skin clean and dry. Take showers or baths every day. Contact a doctor if:  Your cyst has symptoms of infection.  Your condition does not improve or gets worse.  You have a cyst that looks different from other cysts you have had.  You have a fever. Get help right away if:  Redness spreads from the cyst into the area close by. Summary  An epidermoid cyst is a small lump under your skin.  If a cyst becomes infected, treatment may include surgery to open and drain the cyst, or to remove it.  Take over-the-counter and prescription medicines only as told by your doctor.  Contact a doctor if your condition is not improving or is getting worse.  Keep all follow-up visits. This information is not intended to replace advice given to you by your health care provider. Make sure you discuss any questions you have with your health care provider. Document Revised: 11/18/2019 Document Reviewed: 11/18/2019 Elsevier Patient Education  2021 Elsevier Inc.  

## 2020-11-29 NOTE — Progress Notes (Signed)
   11/29/2020 2:46 PM   Max Russell 10-16-1980 [id number]  Reason for visit: Scrotal lesion  HPI: I saw [name] in urology clinic today for a new scrotal lump.  He has a long history of low-grade microscopic hematuria, and a renal ultrasound was negative in 2020.  He deferred cystoscopy at that time.  He is here today for a new 1 cm lesion in the right lower scrotum that is nontender.  He has noticed it for the last 3 days.  No urinary symptoms or dysuria.  On exam he is a 1 cm sebaceous cyst in the right scrotum with a scant amount of whitish drainage.  No erythema or evidence of induration or abscess, minimally tender.  Reassurance provided, follow-up as needed.  Recommended antibacterial soap, warm compresses, and return precautions discussed  Billey Co, MD  [facility] [address] [place], [place] [address] [phone]

## 2020-11-30 ENCOUNTER — Telehealth: Payer: Self-pay

## 2020-11-30 NOTE — Telephone Encounter (Signed)
Called pt informed him of the information below. Pt gave verbal understanding. He states that he would like to hold off on Cysto for now and just continue watchful waiting. Advised pt on gross hematuria precautions.

## 2020-11-30 NOTE — Telephone Encounter (Signed)
-----   Message from Billey Co, MD sent at 11/30/2020 11:50 AM EDT ----- Stable low-grade microscopic blood in urine.  Could consider cystoscopy like we have discussed before if he would like to pursue, but highly likely to be normal  Nickolas Madrid, MD 11/30/2020

## 2021-01-31 ENCOUNTER — Encounter: Payer: BC Managed Care – PPO | Admitting: Internal Medicine

## 2021-02-24 ENCOUNTER — Ambulatory Visit (INDEPENDENT_AMBULATORY_CARE_PROVIDER_SITE_OTHER): Payer: 59 | Admitting: Internal Medicine

## 2021-02-24 ENCOUNTER — Encounter: Payer: Self-pay | Admitting: Internal Medicine

## 2021-02-24 ENCOUNTER — Other Ambulatory Visit: Payer: Self-pay

## 2021-02-24 VITALS — BP 118/78 | HR 79 | Temp 98.1°F | Ht 70.0 in | Wt 256.0 lb

## 2021-02-24 DIAGNOSIS — Z0000 Encounter for general adult medical examination without abnormal findings: Secondary | ICD-10-CM | POA: Diagnosis not present

## 2021-02-24 DIAGNOSIS — Z1159 Encounter for screening for other viral diseases: Secondary | ICD-10-CM | POA: Diagnosis not present

## 2021-02-24 DIAGNOSIS — E785 Hyperlipidemia, unspecified: Secondary | ICD-10-CM | POA: Diagnosis not present

## 2021-02-24 NOTE — Progress Notes (Signed)
Date:  02/24/2021   Name:  Max Russell   DOB:  1981/04/02   MRN:  [id number]   Chief Complaint: No chief complaint on file. Max Russell is a 40 y.o. male who presents today for his Complete Annual Exam. He feels well. He reports exercising - " not as much as I should be.". He reports he is sleeping well.   He changed jobs this year and is doing well.  Colonoscopy: not due  Immunization History  Administered Date(s) Administered   Influenza,inj,Quad PF,6+ Mos 07/30/2017, 09/03/2020   Influenza-Unspecified 06/27/2018   Moderna Sars-Covid-2 Vaccination 09/03/2020   PFIZER(Purple Top)SARS-COV-2 Vaccination 12/04/2019, 12/30/2019   Tdap 01/27/2019    HPI  Lab Results  Component Value Date   CREATININE 0.87 01/28/2020   BUN 12 01/28/2020   NA 140 01/28/2020   K 4.5 01/28/2020   CL 100 01/28/2020   CO2 27 01/28/2020   Lab Results  Component Value Date   CHOL 161 01/28/2020   HDL 35 (L) 01/28/2020   LDLCALC 113 (H) 01/28/2020   TRIG 67 01/28/2020   CHOLHDL 4.6 01/28/2020   Lab Results  Component Value Date   TSH 0.818 12/18/2017   Lab Results  Component Value Date   HGBA1C 5.1 01/27/2019   Lab Results  Component Value Date   WBC 9.1 01/28/2020   HGB 14.0 01/28/2020   HCT 42.2 01/28/2020   MCV 84 01/28/2020   PLT 317 01/28/2020   Lab Results  Component Value Date   ALT 27 01/28/2020   AST 22 01/28/2020   ALKPHOS 64 01/28/2020   BILITOT 0.5 01/28/2020     Review of Systems  Constitutional:  Negative for appetite change, chills, diaphoresis, fatigue and unexpected weight change.  HENT:  Negative for hearing loss, tinnitus, trouble swallowing and voice change.   Eyes:  Negative for visual disturbance.  Respiratory:  Negative for choking, shortness of breath and wheezing.   Cardiovascular:  Negative for chest pain, palpitations and leg swelling.  Gastrointestinal:  Negative for abdominal pain, blood in stool, constipation and diarrhea.   Genitourinary:  Negative for difficulty urinating, dysuria and frequency.  Musculoskeletal:  Positive for arthralgias (left hip pain intermittent). Negative for back pain and myalgias.  Skin:  Negative for color change and rash.  Neurological:  Negative for dizziness, syncope and headaches.  Hematological:  Negative for adenopathy.  Psychiatric/Behavioral:  Negative for dysphoric mood and sleep disturbance. The patient is not nervous/anxious.    Patient Active Problem List   Diagnosis Date Noted   Mild hyperlipidemia 01/27/2019   Microscopic hematuria 01/27/2019   Ulnar tunnel syndrome of left wrist 12/12/2016   Hx of dysplastic nevus 12/07/2015   Long term use of drug 12/07/2015   Abdominal lipoma 07/03/2015   Facet syndrome, lumbar 07/03/2015    No Known Allergies  Past Surgical History:  Procedure Laterality Date   dyplastic nevus     x 4    Social History   Tobacco Use   Smoking status: Never   Smokeless tobacco: Never  Substance Use Topics   Alcohol use: Yes    Alcohol/week: 4.0 standard drinks    Types: 4 Standard drinks or equivalent per week   Drug use: No     Medication list has been reviewed and updated.  Current Meds  Medication Sig   naproxen sodium (ALEVE) 220 MG tablet Take 1 tablet by mouth 2 (two) times daily as needed.   Omega-3 Fatty Acids (FISH OIL)  1000 MG CAPS Take 1 capsule by mouth.    PHQ 2/9 Scores 02/24/2021 01/28/2020 09/25/2019 01/27/2019  PHQ - 2 Score 0 2 0 0  PHQ- 9 Score 0 2 - 4    GAD 7 : Generalized Anxiety Score 02/24/2021 01/28/2020  Nervous, Anxious, on Edge 0 0  Control/stop worrying 0 0  Worry too much - different things 0 0  Trouble relaxing 0 0  Restless 0 0  Easily annoyed or irritable 0 0  Afraid - awful might happen 0 0  Total GAD 7 Score 0 0  Anxiety Difficulty Not difficult at all Not difficult at all    BP Readings from Last 3 Encounters:  02/24/21 118/78  11/29/20 (!) 146/74  01/28/20 106/64    Physical  Exam Vitals and nursing note reviewed.  Constitutional:      Appearance: Normal appearance. He is well-developed.  HENT:     Head: Normocephalic.     Right Ear: Tympanic membrane, ear canal and external ear normal.     Left Ear: Tympanic membrane, ear canal and external ear normal.     Nose: Nose normal.  Eyes:     Conjunctiva/sclera: Conjunctivae normal.     Pupils: Pupils are equal, round, and reactive to light.  Neck:     Thyroid: No thyromegaly.     Vascular: No carotid bruit.  Cardiovascular:     Rate and Rhythm: Normal rate and regular rhythm.     Heart sounds: Normal heart sounds.  Pulmonary:     Effort: Pulmonary effort is normal.     Breath sounds: Normal breath sounds. No wheezing.  Chest:  Breasts:    Right: No mass.     Left: No mass.  Abdominal:     General: Bowel sounds are normal.     Palpations: Abdomen is soft.     Tenderness: There is no abdominal tenderness.  Musculoskeletal:        General: Normal range of motion.     Cervical back: Normal range of motion and neck supple.  Lymphadenopathy:     Cervical: No cervical adenopathy.  Skin:    General: Skin is warm and dry.     Findings: Lesion present.     Comments: Multiple red papules over abdomen and chest c/w insect bites  Neurological:     Mental Status: He is alert and oriented to person, place, and time.     Deep Tendon Reflexes: Reflexes are normal and symmetric.  Psychiatric:        Attention and Perception: Attention normal.        Mood and Affect: Mood normal.        Thought Content: Thought content normal.    Wt Readings from Last 3 Encounters:  02/24/21 256 lb (116.1 kg)  11/29/20 251 lb (113.9 kg)  01/28/20 257 lb (116.6 kg)    BP 118/78 (BP Location: Right Arm, Patient Position: Sitting, Cuff Size: Normal)   Pulse 79   Temp 98.1 F (36.7 C) (Oral)   Ht 5\' 10"  (1.778 m)   Wt 256 lb (116.1 kg)   SpO2 98%   BMI 36.73 kg/m   Assessment and Plan: 1. Annual physical exam Exam is  normal except for weight. Encourage regular exercise and appropriate dietary changes. Vaccinations are up to date. Colonoscopy and PSA are not yet recommended - CBC with Differential/Platelet - Comprehensive metabolic panel  2. Mild hyperlipidemia Check labs and advise - Lipid panel  3. Need for hepatitis C  screening test - Hepatitis C antibody   Partially dictated using Editor, commissioning. Any errors are unintentional.  Halina Maidens, MD [facility]  02/24/2021

## 2021-02-25 LAB — CBC WITH DIFFERENTIAL/PLATELET
Basophils Absolute: 0.1 10*3/uL (ref 0.0–0.2)
Basos: 1 %
EOS (ABSOLUTE): 0.1 10*3/uL (ref 0.0–0.4)
Eos: 2 %
Hematocrit: 44.5 % (ref 37.5–51.0)
Hemoglobin: 14.9 g/dL (ref 13.0–17.7)
Immature Grans (Abs): 0 10*3/uL (ref 0.0–0.1)
Immature Granulocytes: 0 %
Lymphocytes Absolute: 2.3 10*3/uL (ref 0.7–3.1)
Lymphs: 28 %
MCH: 28.2 pg (ref 26.6–33.0)
MCHC: 33.5 g/dL (ref 31.5–35.7)
MCV: 84 fL (ref 79–97)
Monocytes Absolute: 0.6 10*3/uL (ref 0.1–0.9)
Monocytes: 8 %
Neutrophils Absolute: 4.9 10*3/uL (ref 1.4–7.0)
Neutrophils: 61 %
Platelets: 329 10*3/uL (ref 150–450)
RBC: 5.28 x10E6/uL (ref 4.14–5.80)
RDW: 12.7 % (ref 11.6–15.4)
WBC: 7.9 10*3/uL (ref 3.4–10.8)

## 2021-02-25 LAB — LIPID PANEL
Chol/HDL Ratio: 3.9 ratio (ref 0.0–5.0)
Cholesterol, Total: 163 mg/dL (ref 100–199)
HDL: 42 mg/dL (ref >39)
LDL Chol Calc (NIH): 111 mg/dL — ABNORMAL HIGH (ref 0–99)
Triglycerides: 49 mg/dL (ref 0–149)
VLDL Cholesterol Cal: 10 mg/dL (ref 5–40)

## 2021-02-25 LAB — COMPREHENSIVE METABOLIC PANEL
ALT: 19 IU/L (ref 0–44)
AST: 21 IU/L (ref 0–40)
Albumin/Globulin Ratio: 1.8 (ref 1.2–2.2)
Albumin: 4.7 g/dL (ref 4.0–5.0)
Alkaline Phosphatase: 66 IU/L (ref 44–121)
BUN/Creatinine Ratio: 13 (ref 9–20)
BUN: 13 mg/dL (ref 6–24)
Bilirubin Total: 0.5 mg/dL (ref 0.0–1.2)
CO2: 27 mmol/L (ref 20–29)
Calcium: 9.9 mg/dL (ref 8.7–10.2)
Chloride: 100 mmol/L (ref 96–106)
Creatinine, Ser: 0.98 mg/dL (ref 0.76–1.27)
Globulin, Total: 2.6 g/dL (ref 1.5–4.5)
Glucose: 95 mg/dL (ref 65–99)
Potassium: 5.4 mmol/L — ABNORMAL HIGH (ref 3.5–5.2)
Sodium: 142 mmol/L (ref 134–144)
Total Protein: 7.3 g/dL (ref 6.0–8.5)
eGFR: 99 mL/min/{1.73_m2} (ref >59)

## 2021-02-25 LAB — HEPATITIS C ANTIBODY: Hep C Virus Ab: <0.1 s/co ratio (ref 0.0–0.9)

## 2021-05-08 ENCOUNTER — Ambulatory Visit: Payer: Self-pay

## 2021-05-08 NOTE — Telephone Encounter (Signed)
Noted  KP 
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
## 2021-05-18 ENCOUNTER — Encounter: Payer: Self-pay | Admitting: Internal Medicine

## 2021-05-18 ENCOUNTER — Ambulatory Visit (INDEPENDENT_AMBULATORY_CARE_PROVIDER_SITE_OTHER): Payer: Self-pay | Admitting: Internal Medicine

## 2021-05-18 ENCOUNTER — Other Ambulatory Visit: Payer: Self-pay

## 2021-05-18 VITALS — BP 138/82 | HR 97 | Ht 70.0 in | Wt 256.8 lb

## 2021-05-18 DIAGNOSIS — M62838 Other muscle spasm: Secondary | ICD-10-CM

## 2021-05-18 DIAGNOSIS — S20219A Contusion of unspecified front wall of thorax, initial encounter: Secondary | ICD-10-CM

## 2021-05-18 DIAGNOSIS — M25561 Pain in right knee: Secondary | ICD-10-CM
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
## 2021-07-03 ENCOUNTER — Encounter: Payer: Self-pay | Admitting: Physician Assistant

## 2021-07-03 ENCOUNTER — Other Ambulatory Visit: Payer: Self-pay

## 2021-07-03 ENCOUNTER — Ambulatory Visit (INDEPENDENT_AMBULATORY_CARE_PROVIDER_SITE_OTHER): Payer: 59 | Admitting: Physician Assistant

## 2021-07-03 VITALS — BP 129/81 | HR 91 | Ht 70.0 in | Wt 247.0 lb

## 2021-07-03 DIAGNOSIS — N509 Disorder of male genital organs, unspecified: Secondary | ICD-10-CM | POA: Diagnosis not present

## 2021-07-03 NOTE — Progress Notes (Signed)
07/03/2021 3:03 PM   [name] 1980/12/04 [id number]  CC: Chief Complaint  Patient presents with   Other   HPI: Max Russell is a 40 y.o. male with PMH microscopic hematuria who has declined cystoscopy and left testicle axis deviation who presents today for evaluation of left testicular rotation.   Today he reports intermittent episodes of twisting of the left testicle that spontaneously resolves.  The right testicle is unaffected.  He states this occurs approximately 15 times per year and is never associated with pain, redness, or swelling.  He saw Dr. Erlene Quan with similar concerns in 2017, but states he feels that the twisting events are happening more frequently than prior.  PMH: Past Medical History:  Diagnosis Date   Abdominal lipoma 07/03/2015   Facet syndrome, lumbar 07/03/2015   No previous injury or evaluation    Hx of dysplastic nevus 12/07/2015   Long term use of drug 12/07/2015   Shortened PR interval 07/30/2017   Testicular abnormality 06/04/2016   Hx of possible spermatocele on left.    Surgical History: Past Surgical History:  Procedure Laterality Date   dyplastic nevus     x 4    Home Medications:  Allergies as of 07/03/2021   No Known Allergies      Medication List        Accurate as of July 03, 2021  3:03 PM. If you have any questions, ask your nurse or doctor.          Fish Oil 1000 MG Caps Take 1 capsule by mouth.   naproxen sodium 220 MG tablet Commonly known as: ALEVE Take 1 tablet by mouth 2 (two) times daily as needed.        Allergies:  No Known Allergies  Family History: Family History  Problem Relation Age of Onset   Hyperlipidemia Mother    Hypertension Mother    Gallstones Mother    Prostate cancer Father 78   Kidney cancer Neg Hx     Social History:   reports that he has never smoked. He has never used smokeless tobacco. He reports current alcohol use of about 4.0 standard drinks per week. He  reports that he does not use drugs.  Physical Exam: BP 129/81   Pulse 91   Ht 5\' 10"  (1.778 m)   Wt 247 lb (112 kg)   BMI 35.44 kg/m   Constitutional:  Alert and oriented, no acute distress, nontoxic appearing HEENT: [place], AT Cardiovascular: No clubbing, cyanosis, or edema Respiratory: Normal respiratory effort, no increased work of breathing GU: Patent urethral meatus.  Bilateral descended testicles, left testicle is slightly more horizontally than right. Skin: No rashes, bruises or suspicious lesions Neurologic: Grossly intact, no focal deficits, moving all 4 extremities Psychiatric: Normal mood and affect  Assessment & Plan:   1. 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  Fortunately, history and physical exam are not consistent with torsion.  I counseled the patient extensively on signs and symptoms of torsion including acute testicular pain, redness, and/or swelling and counseled him to proceed immediately to the emergency room if this occurs.  No evidence of acute scrotum today, will obtain scrotal ultrasound to further evaluate his anatomy.  We discussed that if he does have Bell Clapper deformity, treatment options include observation versus orchiopexy.  Patient is curious about the effect of scrotal surgery on fertility but I assured him that orchiopexy is not expected to  impact fertility. - US SCROTUM W/DOPPLER; Future   Return for Will call with results.  Debroah Loop, PA-C  [facility] [address], [place], [place] [address] [phone]

## 2021-07-17 ENCOUNTER — Other Ambulatory Visit: Payer: Self-pay

## 2021-07-17 ENCOUNTER — Ambulatory Visit
Admission: RE | Admit: 2021-07-17 | Discharge: 2021-07-17 | Disposition: A | Discharge status: Home / Self Care | Payer: 59 | Source: Ambulatory Visit | Attending: Physician Assistant | Admitting: Physician Assistant

## 2021-07-17 DIAGNOSIS — N509 Disorder of male genital organs, unspecified: Secondary | ICD-10-CM | POA: Diagnosis present

## 2021-07-27 ENCOUNTER — Telehealth: Payer: Self-pay

## 2021-07-27 NOTE — Telephone Encounter (Signed)
Left pt msg to return call to be scheduled with Dr Diamantina Providence.

## 2021-07-27 NOTE — Telephone Encounter (Signed)
-----   Message from Debroah Loop, [place] sent at 07/27/2021 11:55 AM EST ----- I just sent him a MyChart message about his results. Can you please call him to schedule an appt with Dr. Diamantina Providence to discuss further? ----- Message ----- From: Interface, Rad Results In Sent: 07/17/2021   1:10 PM EST To: Debroah Loop, PA-C

## 2021-08-17 ENCOUNTER — Other Ambulatory Visit: Payer: Self-pay

## 2021-08-17 ENCOUNTER — Encounter: Payer: Self-pay | Admitting: Urology

## 2021-08-17 ENCOUNTER — Ambulatory Visit (INDEPENDENT_AMBULATORY_CARE_PROVIDER_SITE_OTHER): Payer: 59 | Admitting: Urology

## 2021-08-17 VITALS — BP 124/68 | HR 88 | Ht 70.0 in | Wt 234.0 lb

## 2021-08-17 DIAGNOSIS — R3121 Asymptomatic microscopic hematuria: Secondary | ICD-10-CM

## 2021-08-17 DIAGNOSIS — N5089 Other specified disorders of the male genital organs: Secondary | ICD-10-CM | POA: Diagnosis not present

## 2021-08-17 NOTE — Progress Notes (Signed)
° °  08/17/2021 11:38 AM   [name] 01/26/1981 [id number]  Reason for visit: Discuss scrotal ultrasound results, history of microscopic hematuria  HPI: 40 year old male with history of asymptomatic microscopic hematuria(negative renal ultrasound, he deferred cystoscopy) as well as sensation of left testicle rotating within the scrotum.  He has had this problem since at least 2017, and saw Dr. Erlene Quan at that time.  He describes it as a feeling of an abnormal sensation in the left testicle, but he does not have any pain.  He had a recent scrotal ultrasound on 07/17/2021 and I personally reviewed these images that show a completely normal testicle on each side with no masses or other abnormalities.  On exam today he does have a slightly more horizontal lie of the left testicle, but this is nontender, there are no masses or lesions.  Reassurance was provided.  Nothing in his clinical history would indicate intermittent torsion, his scrotal ultrasound and physical exam are completely benign, and I do not see any indication for prophylactic orchidopexy.  Return precautions discussed regarding testicular torsion and signs and symptoms to be aware of.  Follow-up with urology as needed  Billey Co, [place] [address] [place], [place] [address] [phone]

## 2021-12-30 IMAGING — US US SCROTUM W/ DOPPLER COMPLETE
1 series · 14 of 25 positions shown · non-contrast
Comparison: None

CLINICAL DATA: Intermittent twisting of LEFT testis, possible bowel
Em Amer deformity

EXAM:
SCROTAL ULTRASOUND
DOPPLER ULTRASOUND OF THE TESTICLES
TECHNIQUE: Complete ultrasound examination of the testicles, epididymis, and
other scrotal structures was performed. Color and spectral Doppler
ultrasound were also utilized to evaluate blood flow to the
testicles.

[Series 1: us scrotum w/ doppler complete · 0.07mm/px · 14 of 51 slices shown]
[im 1/51]
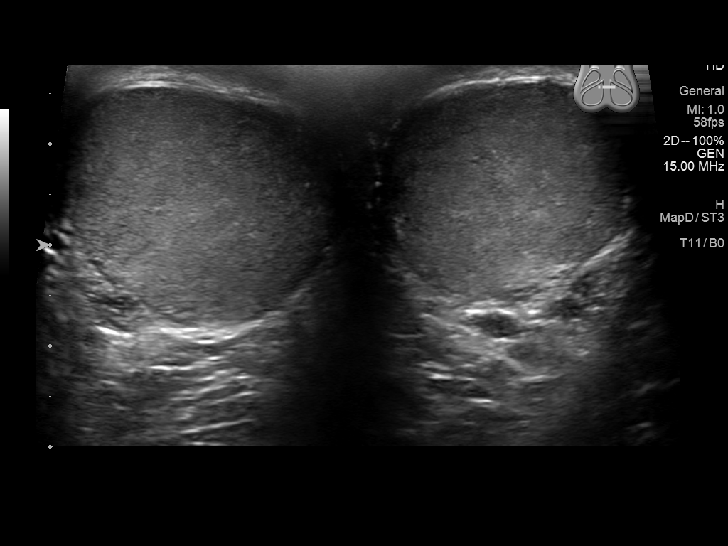
[im 5/51]
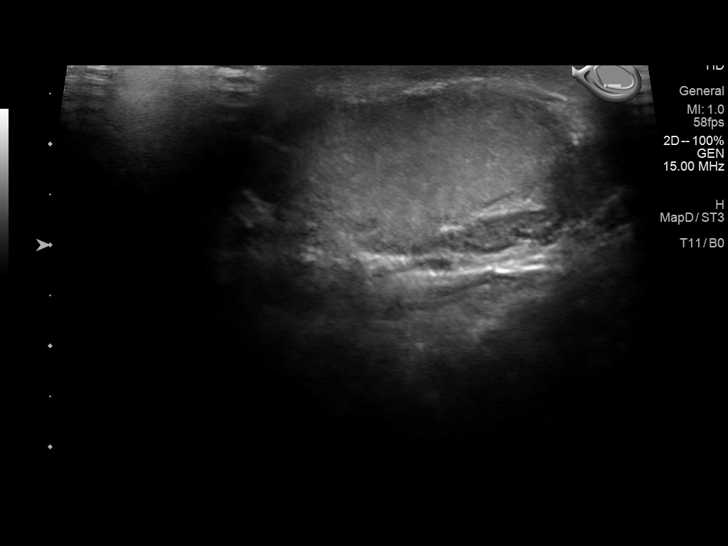
[im 9/51]
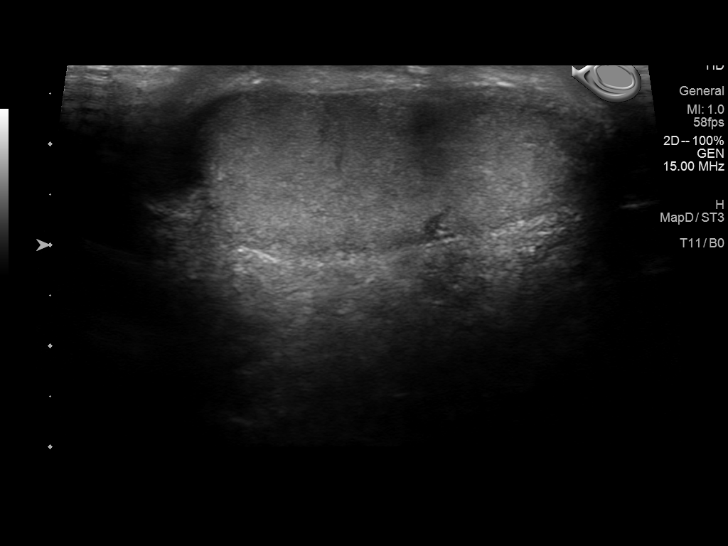
[im 13/51]
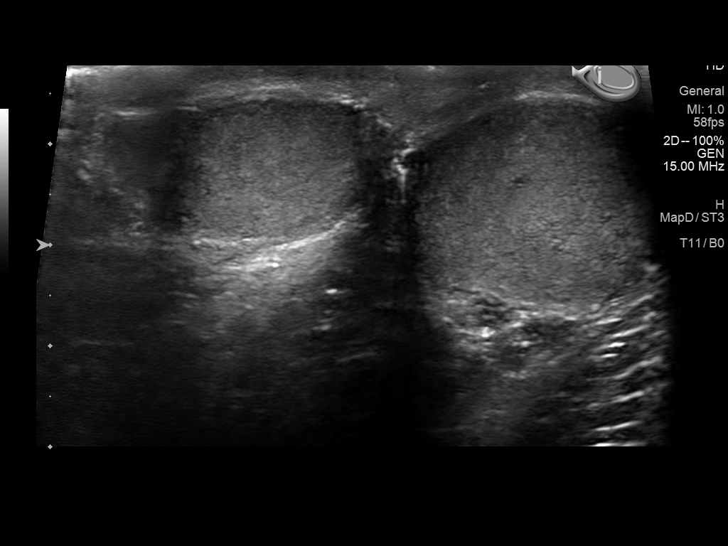
[im 17/51]
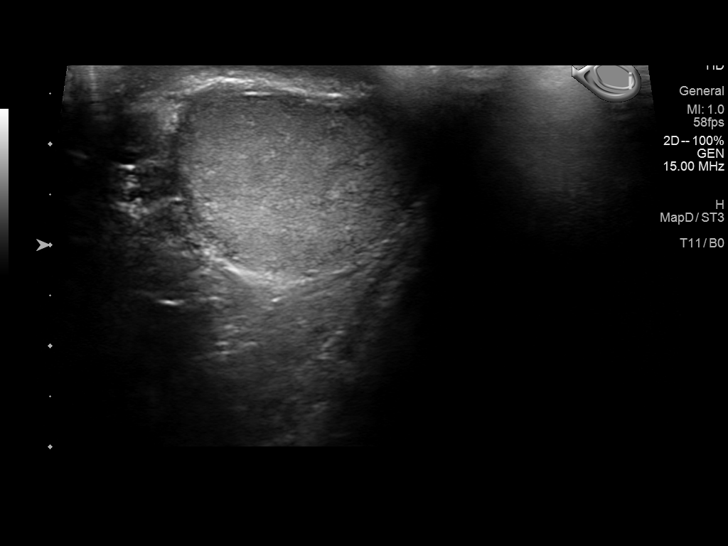
[im 19/51]
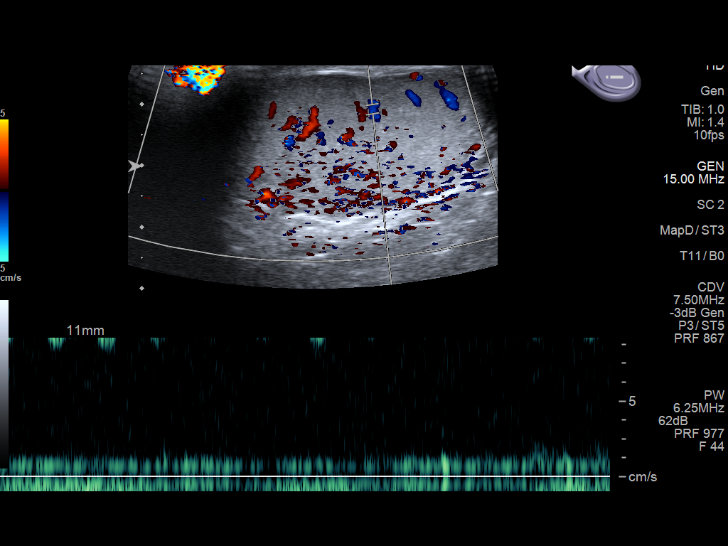
[im 23/51]
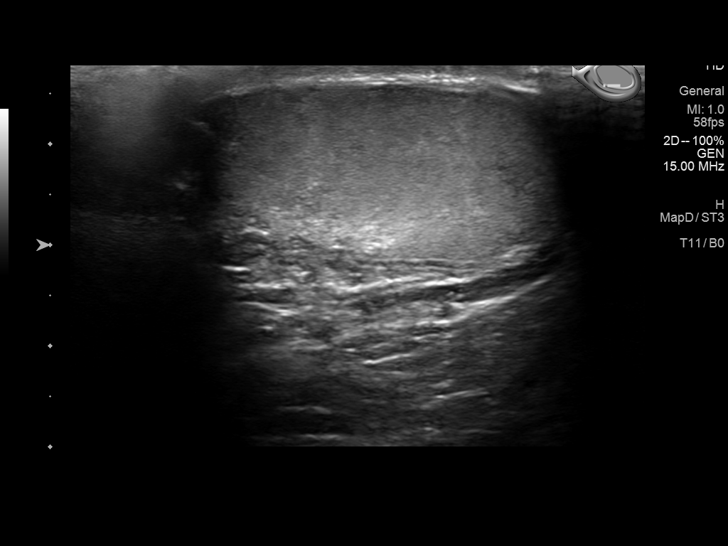
[im 28/51]
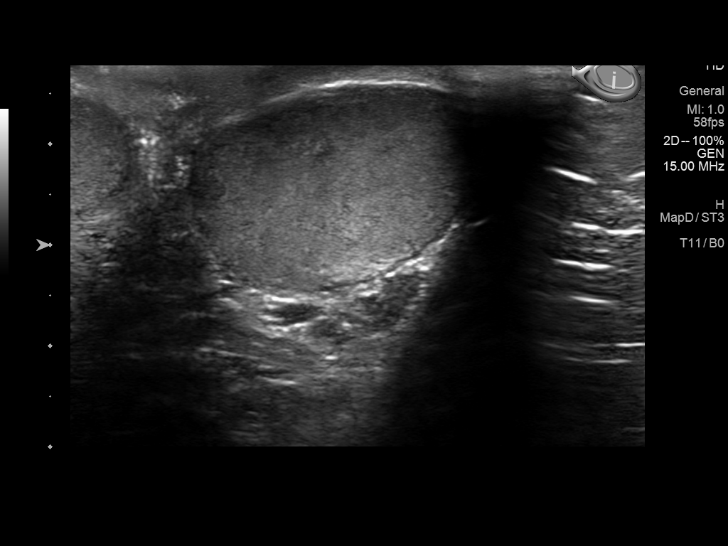
[im 32/51]
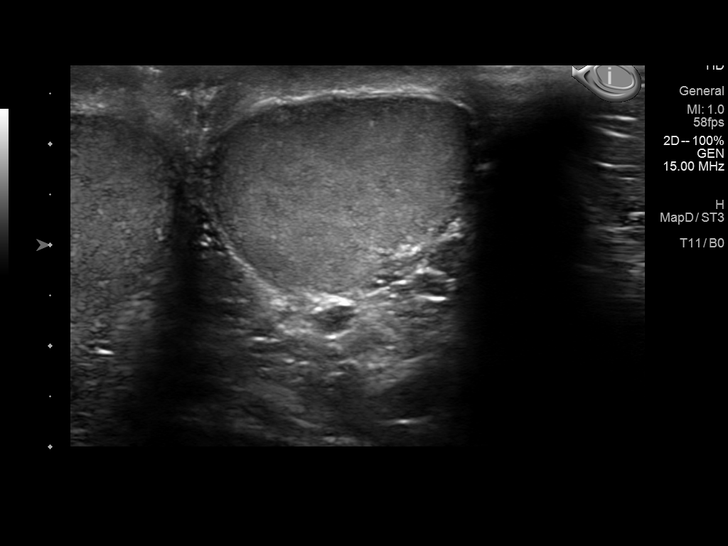
[im 34/51]
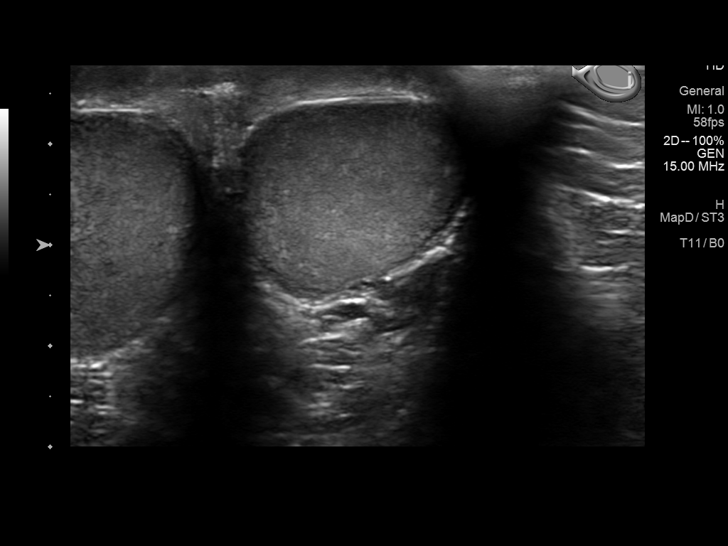
[im 38/51]
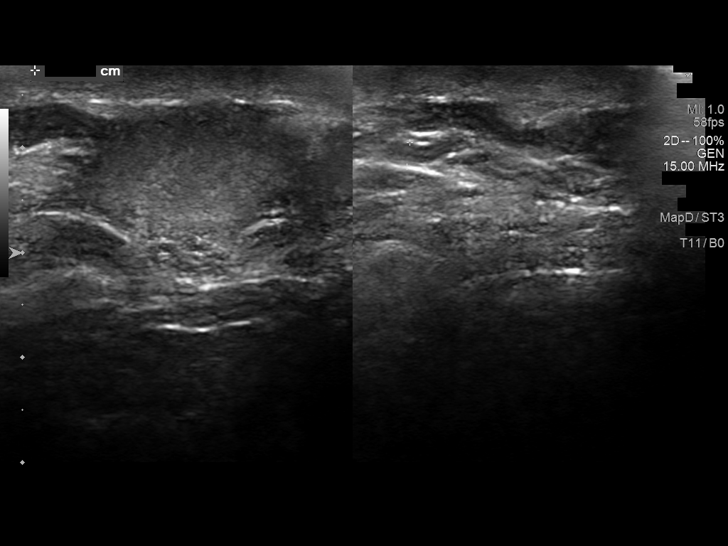
[im 42/51]
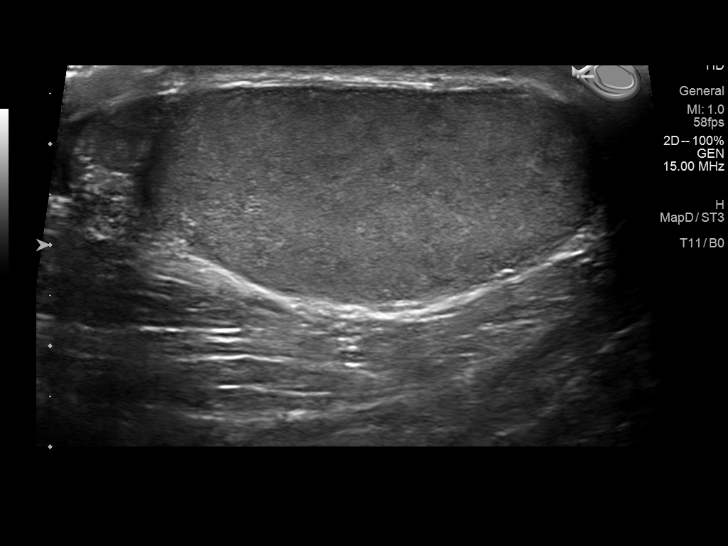
[im 46/51]
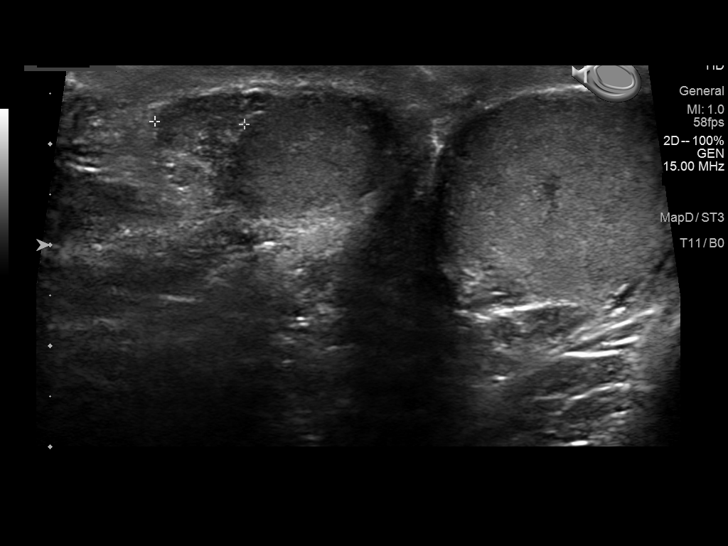
[im 51/51]
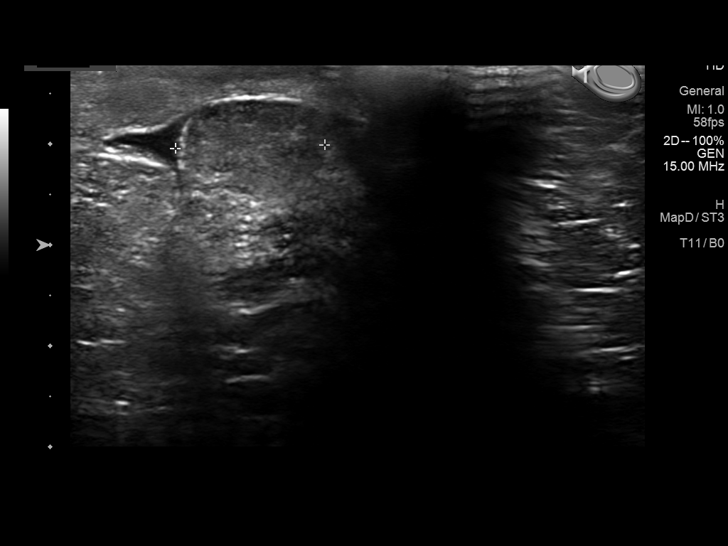

[14 of 25 positions shown; findings below may reference images not displayed]

FINDINGS: Right testicle

Measurements: 4.5 x 2.4 x 2.9 cm. Normal echogenicity without mass
or calcification. Internal blood flow present on color Doppler
imaging.

Left testicle

Measurements: 4.1 x 2.2 x 2.6 cm. Normal echogenicity without mass
or calcification. Internal blood flow present on color Doppler
imaging.

Right epididymis:  Normal in size and appearance.

Left epididymis:  Normal in size and appearance.

Hydrocele:  None visualized.

Varicocele:  None visualized.

Pulsed Doppler interrogation of both testes demonstrates normal low
resistance arterial and venous waveforms bilaterally.
IMPRESSION: Normal exam.

## 2022-02-13 ENCOUNTER — Telehealth: Payer: Self-pay | Admitting: Internal Medicine

## 2022-02-13 NOTE — Telephone Encounter (Signed)
Left VM to call back to discuss request. Not current med

## 2022-02-13 NOTE — Telephone Encounter (Signed)
Spoke to patient- this medication will be a new start medication for him. Patient states he had problems a couple weeks ago and so he feels he might need the medication. Patient has upcoming appointment 02/19/22 to discuss with provider.

## 2022-02-13 NOTE — Telephone Encounter (Signed)
Medication Refill - Medication: cialis  Has the patient contacted their pharmacy? No. Patient is requesting a new medication.    Preferred Pharmacy (with phone number or street name): [id number] [place]  [phone] Fax:  [phone]     Has the patient been seen for an appointment in the last year OR does the patient have an upcoming appointment? Yes.    Agent: Please be advised that RX refills may take up to 3 business days. We ask that you follow-up with your pharmacy.

## 2022-02-19 ENCOUNTER — Encounter: Payer: Self-pay | Admitting: Internal Medicine

## 2022-02-19 ENCOUNTER — Ambulatory Visit (INDEPENDENT_AMBULATORY_CARE_PROVIDER_SITE_OTHER): Payer: 59 | Admitting: Internal Medicine

## 2022-02-19 VITALS — BP 120/76 | HR 85 | Ht 70.0 in | Wt 236.0 lb

## 2022-02-19 DIAGNOSIS — N529 Male erectile dysfunction, unspecified: Secondary | ICD-10-CM | POA: Diagnosis not present

## 2022-02-19 MED ORDER — SILDENAFIL CITRATE 20 MG PO TABS: 20.0000 mg | ORAL_TABLET | Freq: Every day | ORAL | 0 refills | Status: DC | PRN

## 2022-02-19 NOTE — Progress Notes (Signed)
Date:  02/19/2022   Name:  Max Russell   DOB:  08-Jul-1981   MRN:  [id number]   Chief Complaint: Erectile Dysfunction  Erectile Dysfunction This is a new problem. The current episode started 1 to 4 weeks ago (X1 month). He reports no anxiety. Pertinent negatives include no chills. Past treatments include nothing. There are no known risk factors.  Initial sexual encounter with a new partner was unsuccessful.  He was unable to achieve erection sufficient for penetration, despite several attempts.  He does have AM erections, no pain or curvature noted.  He does not drink much alcohol, does not take any prescription medications.   Lab Results  Component Value Date   NA 142 02/24/2021   K 5.4 (H) 02/24/2021   CO2 27 02/24/2021   GLUCOSE 95 02/24/2021   BUN 13 02/24/2021   CREATININE 0.98 02/24/2021   CALCIUM 9.9 02/24/2021   EGFR 99 02/24/2021   GFRNONAA 107 01/28/2020   Lab Results  Component Value Date   CHOL 163 02/24/2021   HDL 42 02/24/2021   LDLCALC 111 (H) 02/24/2021   TRIG 49 02/24/2021   CHOLHDL 3.9 02/24/2021   Lab Results  Component Value Date   TSH 0.818 12/18/2017   Lab Results  Component Value Date   HGBA1C 5.1 01/27/2019   Lab Results  Component Value Date   WBC 7.9 02/24/2021   HGB 14.9 02/24/2021   HCT 44.5 02/24/2021   MCV 84 02/24/2021   PLT 329 02/24/2021   Lab Results  Component Value Date   ALT 19 02/24/2021   AST 21 02/24/2021   ALKPHOS 66 02/24/2021   BILITOT 0.5 02/24/2021   No results found for: "25OHVITD2", "25OHVITD3", "VD25OH"   Review of Systems  Constitutional:  Negative for chills, fatigue and fever.  Respiratory:  Negative for chest tightness and shortness of breath.   Cardiovascular:  Negative for chest pain.  Genitourinary:  Negative for penile pain and testicular pain.  Psychiatric/Behavioral:  Negative for dysphoric mood and sleep disturbance. The patient is not nervous/anxious.     Patient Active Problem List    Diagnosis Date Noted   Mild hyperlipidemia 01/27/2019   Microscopic hematuria 01/27/2019   Ulnar tunnel syndrome of left wrist 12/12/2016   Hx of dysplastic nevus 12/07/2015   Long term use of drug 12/07/2015   Abdominal lipoma 07/03/2015   Facet syndrome, lumbar 07/03/2015    No Known Allergies  Past Surgical History:  Procedure Laterality Date   dyplastic nevus     x 4    Social History   Tobacco Use   Smoking status: Never   Smokeless tobacco: Never  Substance Use Topics   Alcohol use: Yes    Alcohol/week: 4.0 standard drinks of alcohol    Types: 4 Standard drinks or equivalent per week   Drug use: No     Medication list has been reviewed and updated.  Current Meds  Medication Sig   Citrulline POWD Take by mouth as needed.   naproxen sodium (ALEVE) 220 MG tablet Take 1 tablet by mouth 2 (two) times daily as needed.   Omega-3 Fatty Acids (FISH OIL) 1000 MG CAPS Take 1 capsule by mouth.       05/18/2021    8:09 AM 02/24/2021   10:41 AM 01/28/2020    9:01 AM  GAD 7 : Generalized Anxiety Score  Nervous, Anxious, on Edge 0 0 0  Control/stop worrying 0 0 0  Worry too much -  different things 0 0 0  Trouble relaxing 0 0 0  Restless 0 0 0  Easily annoyed or irritable 0 0 0  Afraid - awful might happen 0 0 0  Total GAD 7 Score 0 0 0  Anxiety Difficulty Not difficult at all Not difficult at all Not difficult at all       05/18/2021    8:09 AM  Depression screen PHQ 2/9  Decreased Interest 0  Down, Depressed, Hopeless 0  PHQ - 2 Score 0  Altered sleeping 0  Tired, decreased energy 0  Change in appetite 0  Feeling bad or failure about yourself  0  Trouble concentrating 0  Moving slowly or fidgety/restless 0  Suicidal thoughts 0  PHQ-9 Score 0  Difficult doing work/chores Not difficult at all    BP Readings from Last 3 Encounters:  02/19/22 120/76  08/17/21 124/68  07/03/21 129/81    Physical Exam Vitals and nursing note reviewed.  Constitutional:       General: He is not in acute distress.    Appearance: Normal appearance. He is well-developed.  HENT:     Head: Normocephalic and atraumatic.  Neck:     Vascular: No carotid bruit.  Cardiovascular:     Rate and Rhythm: Normal rate and regular rhythm.  Pulmonary:     Effort: Pulmonary effort is normal. No respiratory distress.     Breath sounds: No wheezing or rhonchi.  Musculoskeletal:     Cervical back: Normal range of motion.     Right lower leg: No edema.     Left lower leg: No edema.  Lymphadenopathy:     Cervical: No cervical adenopathy.  Skin:    General: Skin is warm and dry.     Findings: No rash.  Neurological:     Mental Status: He is alert and oriented to person, place, and time.  Psychiatric:        Mood and Affect: Mood normal.        Behavior: Behavior normal.     Wt Readings from Last 3 Encounters:  02/19/22 236 lb (107 kg)  08/17/21 234 lb (106.1 kg)  07/03/21 247 lb (112 kg)    BP 120/76   Pulse 85   Ht 5\' 10"  (1.778 m)   Wt 236 lb (107 kg)   SpO2 98%   BMI 33.86 kg/m   Assessment and Plan: 1. Erectile dysfunction, unspecified erectile dysfunction type Will initiate low dose sildenafil daily as needed Precautions given.  Questions answered. - sildenafil (REVATIO) 20 MG tablet; Take 1 tablet (20 mg total) by mouth daily as needed.  Dispense: 30 tablet; Refill: 0   Partially dictated using Animal nutritionist. Any errors are unintentional.  Bari Edward, MD [facility]  02/19/2022

## 2022-03-02 ENCOUNTER — Encounter: Payer: Self-pay | Admitting: Internal Medicine

## 2022-03-02 ENCOUNTER — Ambulatory Visit (INDEPENDENT_AMBULATORY_CARE_PROVIDER_SITE_OTHER): Payer: 59 | Admitting: Internal Medicine

## 2022-03-02 VITALS — BP 110/72 | HR 85 | Temp 98.2°F | Ht 70.0 in | Wt 236.0 lb

## 2022-03-02 DIAGNOSIS — Z125 Encounter for screening for malignant neoplasm of prostate: Secondary | ICD-10-CM | POA: Diagnosis not present

## 2022-03-02 DIAGNOSIS — Z0000 Encounter for general adult medical examination without abnormal findings: Secondary | ICD-10-CM | POA: Diagnosis not present

## 2022-03-02 DIAGNOSIS — E785 Hyperlipidemia, unspecified: Secondary | ICD-10-CM | POA: Diagnosis not present

## 2022-03-02 DIAGNOSIS — J069 Acute upper respiratory infection, unspecified: Secondary | ICD-10-CM | POA: Diagnosis not present

## 2022-03-02 NOTE — Progress Notes (Signed)
Date:  03/02/2022   Name:  Max Russell   DOB:  1981/05/11   MRN:  [id number]   Chief Complaint: Annual Exam and Cough Max Russell is a 41 y.o. male who presents today for his Complete Annual Exam. He feels poorly- patient has a cough today. He reports exercising some. He reports he is sleeping fairly well.  Colonoscopy: none  Immunization History  Administered Date(s) Administered  . Influenza,inj,Quad PF,6+ Mos 07/30/2017, 09/03/2020  . Influenza-Unspecified 06/27/2018  . Moderna Sars-Covid-2 Vaccination 09/03/2020  . PFIZER(Purple Top)SARS-COV-2 Vaccination 12/04/2019, 12/30/2019  . Tdap 01/27/2019   There are no preventive care reminders to display for this patient.  No results found for: "PSA1", "PSA"  ED - he has used the sildenafil with good results and no side effects. Cough This is a new problem. The current episode started yesterday. The cough is Productive of sputum. Associated symptoms include nasal congestion, postnasal drip, rhinorrhea and a sore throat. Pertinent negatives include no chest pain, chills, ear pain, fever, headaches, myalgias, rash, shortness of breath or wheezing.    Lab Results  Component Value Date   NA 142 02/24/2021   K 5.4 (H) 02/24/2021   CO2 27 02/24/2021   GLUCOSE 95 02/24/2021   BUN 13 02/24/2021   CREATININE 0.98 02/24/2021   CALCIUM 9.9 02/24/2021   EGFR 99 02/24/2021   GFRNONAA 107 01/28/2020   Lab Results  Component Value Date   CHOL 163 02/24/2021   HDL 42 02/24/2021   LDLCALC 111 (H) 02/24/2021   TRIG 49 02/24/2021   CHOLHDL 3.9 02/24/2021   Lab Results  Component Value Date   TSH 0.818 12/18/2017   Lab Results  Component Value Date   HGBA1C 5.1 01/27/2019   Lab Results  Component Value Date   WBC 7.9 02/24/2021   HGB 14.9 02/24/2021   HCT 44.5 02/24/2021   MCV 84 02/24/2021   PLT 329 02/24/2021   Lab Results  Component Value Date   ALT 19 02/24/2021   AST 21 02/24/2021   ALKPHOS 66  02/24/2021   BILITOT 0.5 02/24/2021   No results found for: "25OHVITD2", "25OHVITD3", "VD25OH"   Review of Systems  Constitutional:  Negative for appetite change, chills, diaphoresis, fatigue, fever and unexpected weight change.  HENT:  Positive for postnasal drip, rhinorrhea and sore throat. Negative for ear pain, hearing loss, tinnitus, trouble swallowing and voice change.   Eyes:  Negative for visual disturbance.  Respiratory:  Positive for cough. Negative for choking, shortness of breath and wheezing.   Cardiovascular:  Negative for chest pain, palpitations and leg swelling.  Gastrointestinal:  Negative for abdominal pain, blood in stool, constipation and diarrhea.  Genitourinary:  Negative for difficulty urinating, dysuria and frequency.       ED - treated with sildenafil  Musculoskeletal:  Negative for arthralgias, back pain and myalgias.  Skin:  Negative for color change and rash.  Neurological:  Negative for dizziness, syncope and headaches.  Hematological:  Negative for adenopathy.  Psychiatric/Behavioral:  Negative for dysphoric mood and sleep disturbance. The patient is not nervous/anxious.     Patient Active Problem List   Diagnosis Date Noted  . Mild hyperlipidemia 01/27/2019  . Microscopic hematuria 01/27/2019  . Ulnar tunnel syndrome of left wrist 12/12/2016  . Hx of dysplastic nevus 12/07/2015  . Long term use of drug 12/07/2015  . Abdominal lipoma 07/03/2015  . Facet syndrome, lumbar 07/03/2015    No Known Allergies  Past Surgical History:  Procedure Laterality Date  . dyplastic nevus     x 4    Social History   Tobacco Use  . Smoking status: Never  . Smokeless tobacco: Never  Substance Use Topics  . Alcohol use: Yes    Alcohol/week: 4.0 standard drinks of alcohol    Types: 4 Standard drinks or equivalent per week  . Drug use: No     Medication list has been reviewed and updated.  Current Meds  Medication Sig  . Citrulline POWD Take by mouth  as needed.  . naproxen sodium (ALEVE) 220 MG tablet Take 1 tablet by mouth 2 (two) times daily as needed.  . Omega-3 Fatty Acids (FISH OIL) 1000 MG CAPS Take 1 capsule by mouth.  . sildenafil (REVATIO) 20 MG tablet Take 1 tablet (20 mg total) by mouth daily as needed.       02/19/2022    1:21 PM 05/18/2021    8:09 AM 02/24/2021   10:41 AM 01/28/2020    9:01 AM  GAD 7 : Generalized Anxiety Score  Nervous, Anxious, on Edge 0 0 0 0  Control/stop worrying 0 0 0 0  Worry too much - different things 0 0 0 0  Trouble relaxing 0 0 0 0  Restless 0 0 0 0  Easily annoyed or irritable 0 0 0 0  Afraid - awful might happen 0 0 0 0  Total GAD 7 Score 0 0 0 0  Anxiety Difficulty Not difficult at all Not difficult at all Not difficult at all Not difficult at all       02/19/2022    1:20 PM 05/18/2021    8:09 AM 02/24/2021   10:41 AM  Depression screen PHQ 2/9  Decreased Interest 0 0 0  Down, Depressed, Hopeless 0 0 0  PHQ - 2 Score 0 0 0  Altered sleeping 0 0 0  Tired, decreased energy 0 0 0  Change in appetite 0 0 0  Feeling bad or failure about yourself  0 0 0  Trouble concentrating 0 0 0  Moving slowly or fidgety/restless 0 0 0  Suicidal thoughts 0 0 0  PHQ-9 Score 0 0 0  Difficult doing work/chores Not difficult at all Not difficult at all Not difficult at all    BP Readings from Last 3 Encounters:  03/02/22 110/72  02/19/22 120/76  08/17/21 124/68    Physical Exam Vitals and nursing note reviewed.  Constitutional:      Appearance: Normal appearance. He is well-developed.  HENT:     Head: Normocephalic.     Right Ear: Tympanic membrane, ear canal and external ear normal.     Left Ear: Tympanic membrane, ear canal and external ear normal.     Nose: Nose normal.     Right Sinus: No maxillary sinus tenderness or frontal sinus tenderness.     Left Sinus: No maxillary sinus tenderness or frontal sinus tenderness.     Mouth/Throat:     Pharynx: No oropharyngeal exudate or posterior  oropharyngeal erythema.  Eyes:     Conjunctiva/sclera: Conjunctivae normal.     Pupils: Pupils are equal, round, and reactive to light.  Neck:     Thyroid: No thyromegaly.     Vascular: No carotid bruit.  Cardiovascular:     Rate and Rhythm: Normal rate and regular rhythm.     Heart sounds: Normal heart sounds.  Pulmonary:     Effort: Pulmonary effort is normal.     Breath sounds: Normal  breath sounds. No wheezing.  Chest:  Breasts:    Right: No mass.     Left: No mass.  Abdominal:     General: Bowel sounds are normal.     Palpations: Abdomen is soft.     Tenderness: There is no abdominal tenderness.  Musculoskeletal:        General: Normal range of motion.     Cervical back: Normal range of motion and neck supple.  Lymphadenopathy:     Cervical: No cervical adenopathy.  Skin:    General: Skin is warm and dry.  Neurological:     Mental Status: He is alert and oriented to person, place, and time.     Deep Tendon Reflexes: Reflexes are normal and symmetric.  Psychiatric:        Attention and Perception: Attention normal.        Mood and Affect: Mood normal.        Thought Content: Thought content normal.    Wt Readings from Last 3 Encounters:  03/02/22 236 lb (107 kg)  02/19/22 236 lb (107 kg)  08/17/21 234 lb (106.1 kg)    BP 110/72   Pulse 85   Temp 98.2 F (36.8 C) (Oral)   Ht $R'5\' 10"'ck$  (1.778 m)   Wt 236 lb (107 kg)   SpO2 97%   BMI 33.86 kg/m   Assessment and Plan: 1. Annual physical exam Exam is normal except for weight. Encourage regular exercise and appropriate dietary changes. Up to date on screenings and immunizations. - CBC with Differential/Platelet - Comprehensive metabolic panel - TSH  2. Mild hyperlipidemia Check labs and advise - continue low fat diet - Lipid panel  3. Prostate cancer screening Strong family hx but no sx Brother is 31 years older and has a new dx of elevated PSA - PSA  4. Viral URI with cough Take otc cough syrup or  antihistamines as needed   Partially dictated using Editor, commissioning. Any errors are unintentional.  Halina Maidens, MD [facility]  03/02/2022

## 2022-03-03 LAB — CBC WITH DIFFERENTIAL/PLATELET
Basophils Absolute: 0.1 10*3/uL (ref 0.0–0.2)
Basos: 1 %
EOS (ABSOLUTE): 0.2 10*3/uL (ref 0.0–0.4)
Eos: 2 %
Hematocrit: 41 % (ref 37.5–51.0)
Hemoglobin: 14 g/dL (ref 13.0–17.7)
Immature Grans (Abs): 0 10*3/uL (ref 0.0–0.1)
Immature Granulocytes: 0 %
Lymphocytes Absolute: 1.6 10*3/uL (ref 0.7–3.1)
Lymphs: 20 %
MCH: 28.6 pg (ref 26.6–33.0)
MCHC: 34.1 g/dL (ref 31.5–35.7)
MCV: 84 fL (ref 79–97)
Monocytes Absolute: 0.7 10*3/uL (ref 0.1–0.9)
Monocytes: 9 %
Neutrophils Absolute: 5.5 10*3/uL (ref 1.4–7.0)
Neutrophils: 68 %
Platelets: 285 10*3/uL (ref 150–450)
RBC: 4.9 x10E6/uL (ref 4.14–5.80)
RDW: 12.5 % (ref 11.6–15.4)
WBC: 8.1 10*3/uL (ref 3.4–10.8)

## 2022-03-03 LAB — LIPID PANEL
Chol/HDL Ratio: 3 ratio (ref 0.0–5.0)
Cholesterol, Total: 135 mg/dL (ref 100–199)
HDL: 45 mg/dL (ref >39)
LDL Chol Calc (NIH): 80 mg/dL (ref 0–99)
Triglycerides: 40 mg/dL (ref 0–149)
VLDL Cholesterol Cal: 10 mg/dL (ref 5–40)

## 2022-03-03 LAB — COMPREHENSIVE METABOLIC PANEL
ALT: 24 IU/L (ref 0–44)
AST: 26 IU/L (ref 0–40)
Albumin/Globulin Ratio: 1.7 (ref 1.2–2.2)
Albumin: 4.4 g/dL (ref 4.0–5.0)
Alkaline Phosphatase: 71 IU/L (ref 44–121)
BUN/Creatinine Ratio: 13 (ref 9–20)
BUN: 12 mg/dL (ref 6–24)
Bilirubin Total: 0.4 mg/dL (ref 0.0–1.2)
CO2: 27 mmol/L (ref 20–29)
Calcium: 10.2 mg/dL (ref 8.7–10.2)
Chloride: 99 mmol/L (ref 96–106)
Creatinine, Ser: 0.95 mg/dL (ref 0.76–1.27)
Globulin, Total: 2.6 g/dL (ref 1.5–4.5)
Glucose: 99 mg/dL (ref 70–99)
Potassium: 5.4 mmol/L — ABNORMAL HIGH (ref 3.5–5.2)
Sodium: 137 mmol/L (ref 134–144)
Total Protein: 7 g/dL (ref 6.0–8.5)
eGFR: 102 mL/min/{1.73_m2} (ref >59)

## 2022-03-03 LAB — TSH: TSH: 1.09 u[IU]/mL (ref 0.450–4.500)

## 2022-03-03 LAB — PSA: Prostate Specific Ag, Serum: 1.7 ng/mL (ref 0.0–4.0)

## 2022-04-26 ENCOUNTER — Other Ambulatory Visit: Payer: Self-pay | Admitting: Internal Medicine

## 2022-04-26 ENCOUNTER — Ambulatory Visit: Payer: Self-pay | Admitting: *Deleted

## 2022-04-26 DIAGNOSIS — R21 Rash and other nonspecific skin eruption: Secondary | ICD-10-CM

## 2022-04-26 MED ORDER — NYSTATIN-TRIAMCINOLONE 100000-0.1 UNIT/GM-% EX OINT: 1.0000 | TOPICAL_OINTMENT | Freq: Two times a day (BID) | CUTANEOUS | 5 refills | Status: AC

## 2022-04-26 NOTE — Telephone Encounter (Signed)
Please review.  KP

## 2022-04-26 NOTE — Telephone Encounter (Signed)
Called pt left VM that ointment was sent to [facility] in [place]. Pts name stated on VM>  KP

## 2022-04-26 NOTE — Telephone Encounter (Signed)
Summary: persistent itching   Patient requesting PCP to prescribe nystatin-triamcinolone ointment (MYCOLOG) due to persistent itching. Patient did not want to elaborate declined appointment for now.     [id number] Phone: [phone] Fax: [phone]      Chief Complaint: scrotum itching  Symptoms: no rash, just itch Frequency: frequently Pertinent Negatives: Patient denies fever or rash Disposition: '[]'$ ED /'[]'$ Urgent Care (no appt availability in office) / '[]'$ Appointment(In office/virtual)/ '[]'$  [facility]/ '[]'$ Home Care/ '[]'$ Refused Recommended Disposition /'[]'$ [place] [facility]/ '[x]'$  Follow-up with PCP Additional Notes: Pt states he has been seen for this before and is just requesting nystatin-triamcinolone ointment (MYCOLOG)  be called in again. Pt wishes not to make appt.  Reason for Disposition  [1] Slowly expanding pink-red rash involving inner thigh(s) next to scrotum AND [2] itchy  Answer Assessment - Initial Assessment Questions 1. APPEARANCE of RASH: "Describe the rash."      no 2. LOCATION: "Where is the rash located?"      No rash 3. ONSET: "When did the rash start?"      Not rash 4. ITCHING: "Does the rash itch?" If Yes, ask: "How bad is the itch?"  (Scale 1-10; or mild, moderate, severe)     yes 5. PAIN: "Does the rash hurt?" If Yes, ask: "How bad is the pain?"  (Scale 1-10; or mild, moderate, severe)     annoying 6. OTHER SYMPTOMS: "Do you have any other symptoms?" (e.g., fever)     no 7. PREGNANCY: "Is there any chance you are pregnant?" "When was your last menstrual period?"     na  Protocols used: Jock Itch-A-AH

## 2022-10-22 ENCOUNTER — Encounter: Payer: Self-pay | Admitting: Family Medicine

## 2022-10-22 ENCOUNTER — Ambulatory Visit (INDEPENDENT_AMBULATORY_CARE_PROVIDER_SITE_OTHER): Payer: 59 | Admitting: Family Medicine

## 2022-10-22 VITALS — BP 128/82 | HR 100 | Ht 70.0 in | Wt 256.0 lb

## 2022-10-22 DIAGNOSIS — N5089 Other specified disorders of the male genital organs: Secondary | ICD-10-CM | POA: Diagnosis not present

## 2022-10-22 DIAGNOSIS — Z202 Contact with and (suspected) exposure to infections with a predominantly sexual mode of transmission: Secondary | ICD-10-CM

## 2022-10-22 MED ORDER — DOXYCYCLINE HYCLATE 100 MG PO TABS: 100.0000 mg | ORAL_TABLET | Freq: Two times a day (BID) | ORAL | 0 refills | Status: AC

## 2022-10-22 MED ORDER — AZITHROMYCIN 500 MG PO TABS: 1000.0000 mg | ORAL_TABLET | Freq: Every day | ORAL | 0 refills | Status: AC

## 2022-10-22 NOTE — Patient Instructions (Signed)
-   Take antibiotics for full course - Will contact with results and next steps - If you

## 2022-10-22 NOTE — Progress Notes (Signed)
     Primary Care / Sports Medicine Office Visit  Patient Information:  Patient ID: Max Russell, male DOB: 08/05/1981 Age: 42 y.o. MRN: [id number]   Max Russell is a pleasant 42 y.o. male presenting with the following:  Chief Complaint  Patient presents with   std screening    Blisters on Penis, Swelling.  has had yeast infection in the past. Was given ointment ine past. Last time he had sexual intercourse was 2 wks ago with his girlfriend.     Vitals:   10/22/22 1606  BP: 128/82  Pulse: 100  SpO2: 98%   Vitals:   10/22/22 1606  Weight: 256 lb (116.1 kg)  Height: 5' 10"$  (1.778 m)   Body mass index is 36.73 kg/m.  No results found.   Independent interpretation of notes and tests performed by another provider:   None  Procedures performed:   None  Pertinent History, Exam, Impression, and Recommendations:   [name] was seen today for std screening.  Genital lesion, male Assessment & Plan: Patient presents with genital lesions, dysuria.  Describes initial symptom onset mid last week, dysuria, 2-3 days later noted lesions along the penile shaft, glans, swelling at the pelvis, does have a history of prior tinea cruris.  Denies any discharge, no pain.  Last intercourse 2 weeks prior.  Examination with ulcerations varying shades of resolution primarily along the penile shaft with more open lesions noted at the glans, no discharge, shotty right inguinal lymphadenopathy, nontender throughout.  Plan for STI labs, will start doxycycline for broad-spectrum coverage, will follow lab results once available.  No discahrge, pain swelling  Orders: -     Azithromycin; Take 2 tablets (1,000 mg total) by mouth daily for 1 dose.  Dispense: 2 tablet; Refill: 0 -     Doxycycline Hyclate; Take 1 tablet (100 mg total) by mouth 2 (two) times daily for 14 days.  Dispense: 28 tablet; Refill: 0  Encounter for assessment of STD exposure -     HIV Antibody (routine testing w  rflx) -     RPR -     GC/Chlamydia Probe Amp -     Hepatitis C antibody -     HSV(herpes simplex vrs) 1+2 ab-IgG -     Azithromycin; Take 2 tablets (1,000 mg total) by mouth daily for 1 dose.  Dispense: 2 tablet; Refill: 0 -     Doxycycline Hyclate; Take 1 tablet (100 mg total) by mouth 2 (two) times daily for 14 days.  Dispense: 28 tablet; Refill: 0     Orders & Medications Meds ordered this encounter  Medications   azithromycin (ZITHROMAX) 500 MG tablet    Sig: Take 2 tablets (1,000 mg total) by mouth daily for 1 dose.    Dispense:  2 tablet    Refill:  0   doxycycline (VIBRA-TABS) 100 MG tablet    Sig: Take 1 tablet (100 mg total) by mouth 2 (two) times daily for 14 days.    Dispense:  28 tablet    Refill:  0   Orders Placed This Encounter  Procedures   GC/Chlamydia Probe Amp   HIV Antibody (routine testing w rflx)   RPR   Hepatitis C antibody   HSV(herpes simplex vrs) 1+2 ab-IgG     Return if symptoms worsen or fail to improve.     Montel Culver, MD, [facility]   Primary Care Sports Medicine Primary Care and Sports Medicine at [facility]

## 2022-10-25 LAB — GC/CHLAMYDIA PROBE AMP
Chlamydia trachomatis, NAA: NEGATIVE
Neisseria Gonorrhoeae by PCR: NEGATIVE

## 2022-10-25 NOTE — Assessment & Plan Note (Signed)
Patient presents with genital lesions, dysuria.  Describes initial symptom onset mid last week, dysuria, 2-3 days later noted lesions along the penile shaft, glans, swelling at the pelvis, does have a history of prior tinea cruris.  Denies any discharge, no pain.  Last intercourse 2 weeks prior.  Examination with ulcerations varying shades of resolution primarily along the penile shaft with more open lesions noted at the glans, no discharge, shotty right inguinal lymphadenopathy, nontender throughout.  Plan for STI labs, will start doxycycline for broad-spectrum coverage, will follow lab results once available.  No discahrge, pain swelling

## 2022-10-27 LAB — RPR: RPR Ser Ql: NONREACTIVE

## 2022-10-27 LAB — HIV ANTIBODY (ROUTINE TESTING W REFLEX): HIV Screen 4th Generation wRfx: NONREACTIVE

## 2022-10-27 LAB — HEPATITIS C ANTIBODY: Hep C Virus Ab: NONREACTIVE

## 2022-10-27 LAB — HSV-2 AB, IGG: HSV 2 IgG, Type Spec: <0.91 index (ref 0.00–0.90)

## 2022-10-29 ENCOUNTER — Encounter: Payer: Self-pay | Admitting: Family Medicine

## 2022-10-30 NOTE — Telephone Encounter (Signed)
Please review.  KP

## 2023-03-08 ENCOUNTER — Encounter: Payer: Self-pay | Admitting: Internal Medicine

## 2023-03-08 ENCOUNTER — Ambulatory Visit (INDEPENDENT_AMBULATORY_CARE_PROVIDER_SITE_OTHER): Payer: 59 | Admitting: Internal Medicine

## 2023-03-08 VITALS — BP 106/76 | HR 83 | Ht 70.0 in | Wt 259.0 lb

## 2023-03-08 DIAGNOSIS — Z0000 Encounter for general adult medical examination without abnormal findings: Secondary | ICD-10-CM

## 2023-03-08 DIAGNOSIS — Z79899 Other long term (current) drug therapy: Secondary | ICD-10-CM

## 2023-03-08 DIAGNOSIS — M47816 Spondylosis without myelopathy or radiculopathy, lumbar region: Secondary | ICD-10-CM | POA: Diagnosis not present

## 2023-03-08 DIAGNOSIS — Z131 Encounter for screening for diabetes mellitus: Secondary | ICD-10-CM

## 2023-03-08 DIAGNOSIS — E785 Hyperlipidemia, unspecified: Secondary | ICD-10-CM | POA: Diagnosis not present

## 2023-03-08 NOTE — Assessment & Plan Note (Signed)
Taking Aleve nightly with good benefit Occasional left toe numbness

## 2023-03-08 NOTE — Assessment & Plan Note (Signed)
Lipids controlled with diet changes and exercise The 10-year ASCVD risk score (Arnett DK, et al., 2019) is: 1%   Values used to calculate the score:     Age: 42 years     Sex: Male     Is Non-Hispanic African American: No     Diabetic: No     Tobacco smoker: No     Systolic Blood Pressure: 128 mmHg     Is BP treated: No     HDL Cholesterol: 45 mg/dL     Total Cholesterol: 135 mg/dL

## 2023-03-08 NOTE — Assessment & Plan Note (Addendum)
On nsaids for low back pain

## 2023-03-08 NOTE — Progress Notes (Signed)
Date:  03/08/2023   Name:  Max Russell   DOB:  [birth year]   MRN:  [id number]   Chief Complaint: Annual Exam Max Russell is a 42 y.o. male who presents today for his Complete Annual Exam. He feels fairly well. He reports exercising - none. He reports he is sleeping fairly well. Genital lesions earlier this year have not recurred.  We discussed Guardasil vaccines - he is not high risk and desired to wait.  Colonoscopy: none due next year  Immunization History  Administered Date(s) Administered   Influenza,inj,Quad PF,6+ Mos 07/30/2017, 09/03/2020   Influenza-Unspecified 06/27/2018   Moderna Sars-Covid-2 Vaccination 09/03/2020   PFIZER(Purple Top)SARS-COV-2 Vaccination 12/04/2019, 12/30/2019   Tdap 01/27/2019   Health Maintenance Due  Topic Date Due   COVID-19 Vaccine (4 - 2023-24 season) 04/27/2022    Lab Results  Component Value Date   PSA1 1.7 03/02/2022    Back Pain This is a recurrent problem. The pain is present in the lumbar spine (occasional left toe numbness). The pain does not radiate. The pain is mild. Pertinent negatives include no abdominal pain, chest pain, dysuria or headaches. He has tried NSAIDs (nightly Aleve) for the symptoms.    Lab Results  Component Value Date   NA 137 03/02/2022   K 5.4 (H) 03/02/2022   CO2 27 03/02/2022   GLUCOSE 99 03/02/2022   BUN 12 03/02/2022   CREATININE 0.95 03/02/2022   CALCIUM 10.2 03/02/2022   EGFR 102 03/02/2022   GFRNONAA 107 01/28/2020   Lab Results  Component Value Date   CHOL 135 03/02/2022   HDL 45 03/02/2022   LDLCALC 80 03/02/2022   TRIG 40 03/02/2022   CHOLHDL 3.0 03/02/2022   Lab Results  Component Value Date   TSH 1.090 03/02/2022   Lab Results  Component Value Date   HGBA1C 5.1 01/27/2019   Lab Results  Component Value Date   WBC 8.1 03/02/2022   HGB 14.0 03/02/2022   HCT 41.0 03/02/2022   MCV 84 03/02/2022   PLT 285 03/02/2022   Lab Results  Component Value Date   ALT 24  03/02/2022   AST 26 03/02/2022   ALKPHOS 71 03/02/2022   BILITOT 0.4 03/02/2022   No results found for: "25OHVITD2", "25OHVITD3", "VD25OH"   Review of Systems  Constitutional:  Negative for appetite change, chills, diaphoresis, fatigue and unexpected weight change.  HENT:  Negative for hearing loss, tinnitus, trouble swallowing and voice change.   Eyes:  Negative for visual disturbance.  Respiratory:  Negative for choking, shortness of breath and wheezing.   Cardiovascular:  Negative for chest pain, palpitations and leg swelling.  Gastrointestinal:  Negative for abdominal pain, blood in stool, constipation and diarrhea.  Genitourinary:  Negative for difficulty urinating, dysuria and frequency.  Musculoskeletal:  Positive for back pain. Negative for arthralgias and myalgias.  Skin:  Negative for color change and rash.  Neurological:  Negative for dizziness, syncope and headaches.  Hematological:  Negative for adenopathy.  Psychiatric/Behavioral:  Negative for dysphoric mood and sleep disturbance. The patient is not nervous/anxious.     Patient Active Problem List   Diagnosis Date Noted   Genital lesion, male 10/22/2022   Mild hyperlipidemia 01/27/2019   Microscopic hematuria 01/27/2019   Ulnar tunnel syndrome of left wrist 12/12/2016   Hx of dysplastic nevus 12/07/2015   Long term use of drug 12/07/2015   Abdominal lipoma 07/03/2015   Facet syndrome, lumbar 07/03/2015    No Known Allergies  Past Surgical History:  Procedure Laterality Date   dyplastic nevus     x 4    Social History   Tobacco Use   Smoking status: Never   Smokeless tobacco: Never  Substance Use Topics   Alcohol use: Yes    Alcohol/week: 4.0 standard drinks of alcohol    Types: 4 Standard drinks or equivalent per week   Drug use: No     Medication list has been reviewed and updated.  Current Meds  Medication Sig   Citrulline POWD Take by mouth as needed.   Naproxen Sod-diphenhydrAMINE (ALEVE  PM) 220-25 MG TABS Take by mouth.   naproxen sodium (ALEVE) 220 MG tablet Take 1 tablet by mouth 2 (two) times daily as needed.   nystatin-triamcinolone ointment (MYCOLOG) Apply 1 Application topically 2 (two) times daily. PRN   Omega-3 Fatty Acids (FISH OIL) 1000 MG CAPS Take 1 capsule by mouth.   [DISCONTINUED] sildenafil (REVATIO) 20 MG tablet Take 1 tablet (20 mg total) by mouth daily as needed.       03/08/2023    7:59 AM 02/19/2022    1:21 PM 05/18/2021    8:09 AM 02/24/2021   10:41 AM  GAD 7 : Generalized Anxiety Score  Nervous, Anxious, on Edge 0 0 0 0  Control/stop worrying 0 0 0 0  Worry too much - different things 0 0 0 0  Trouble relaxing 0 0 0 0  Restless 0 0 0 0  Easily annoyed or irritable 0 0 0 0  Afraid - awful might happen 0 0 0 0  Total GAD 7 Score 0 0 0 0  Anxiety Difficulty Not difficult at all Not difficult at all Not difficult at all Not difficult at all       03/08/2023    7:59 AM 02/19/2022    1:20 PM 05/18/2021    8:09 AM  Depression screen PHQ 2/9  Decreased Interest 0 0 0  Down, Depressed, Hopeless 0 0 0  PHQ - 2 Score 0 0 0  Altered sleeping 0 0 0  Tired, decreased energy 0 0 0  Change in appetite 0 0 0  Feeling bad or failure about yourself  0 0 0  Trouble concentrating 0 0 0  Moving slowly or fidgety/restless 0 0 0  Suicidal thoughts 0 0 0  PHQ-9 Score 0 0 0  Difficult doing work/chores Not difficult at all Not difficult at all Not difficult at all    BP Readings from Last 3 Encounters:  03/08/23 106/76  10/22/22 128/82  03/02/22 110/72    Physical Exam Vitals and nursing note reviewed.  Constitutional:      Appearance: Normal appearance. He is well-developed.  HENT:     Head: Normocephalic.     Right Ear: Tympanic membrane, ear canal and external ear normal.     Left Ear: Tympanic membrane, ear canal and external ear normal.     Nose: Nose normal.  Eyes:     Conjunctiva/sclera: Conjunctivae normal.     Pupils: Pupils are equal,  round, and reactive to light.  Neck:     Thyroid: No thyromegaly.     Vascular: No carotid bruit.  Cardiovascular:     Rate and Rhythm: Normal rate and regular rhythm.     Heart sounds: Normal heart sounds.  Pulmonary:     Effort: Pulmonary effort is normal.     Breath sounds: Normal breath sounds. No wheezing.  Chest:  Breasts:    Right: No  mass.     Left: No mass.  Abdominal:     General: Bowel sounds are normal.     Palpations: Abdomen is soft.     Tenderness: There is no abdominal tenderness.  Musculoskeletal:        General: Normal range of motion.     Cervical back: Normal range of motion and neck supple.     Lumbar back: No tenderness. Normal range of motion. Negative right straight leg raise test and negative left straight leg raise test.     Right hip: Normal.     Left hip: Normal.     Right lower leg: No edema.     Left lower leg: No edema.  Lymphadenopathy:     Cervical: No cervical adenopathy.  Skin:    General: Skin is warm and dry.     Capillary Refill: Capillary refill takes less than 2 seconds.  Neurological:     General: No focal deficit present.     Mental Status: He is alert and oriented to person, place, and time.     Gait: Gait normal.     Deep Tendon Reflexes: Reflexes are normal and symmetric.  Psychiatric:        Attention and Perception: Attention normal.        Mood and Affect: Mood normal.        Thought Content: Thought content normal.     Wt Readings from Last 3 Encounters:  03/08/23 259 lb (117.5 kg)  10/22/22 256 lb (116.1 kg)  03/02/22 236 lb (107 kg)    BP 106/76   Pulse 83   Ht 5\' 10"  (1.778 m)   Wt 259 lb (117.5 kg)   SpO2 98%   BMI 37.16 kg/m   Assessment and Plan:  Problem List Items Addressed This Visit     Mild hyperlipidemia (Chronic)    Lipids controlled with diet changes and exercise The 10-year ASCVD risk score (Arnett DK, et al., 2019) is: 1%   Values used to calculate the score:     Age: 29 years     Sex:  Male     Is Non-Hispanic African American: No     Diabetic: No     Tobacco smoker: No     Systolic Blood Pressure: 128 mmHg     Is BP treated: No     HDL Cholesterol: 45 mg/dL     Total Cholesterol: 135 mg/dL       Relevant Orders   Lipid panel   Long term use of drug    On nsaids for low back pain      Relevant Orders   CBC with Differential/Platelet   Comprehensive metabolic panel   Facet syndrome, lumbar    Taking Aleve nightly with good benefit Occasional left toe numbness      Other Visit Diagnoses     Annual physical exam    -  Primary   Normal exam except for weight - has gained 23 lbs this year encourage diet changes and more regular exercise   Relevant Orders   CBC with Differential/Platelet   Comprehensive metabolic panel   Hemoglobin A1c   Lipid panel   Screening for diabetes mellitus       Relevant Orders   Hemoglobin A1c       No follow-ups on file.   Partially dictated using Dragon software, any errors are not intentional.  Reubin Milan, MD [facility] [place], [place]

## 2023-03-09 LAB — CBC WITH DIFFERENTIAL/PLATELET
Basophils Absolute: 0.1 10*3/uL (ref 0.0–0.2)
Basos: 1 %
EOS (ABSOLUTE): 0.4 10*3/uL (ref 0.0–0.4)
Eos: 5 %
Hematocrit: 41.9 % (ref 37.5–51.0)
Hemoglobin: 13.6 g/dL (ref 13.0–17.7)
Immature Grans (Abs): 0 10*3/uL (ref 0.0–0.1)
Immature Granulocytes: 0 %
Lymphocytes Absolute: 2 10*3/uL (ref 0.7–3.1)
Lymphs: 28 %
MCH: 27.3 pg (ref 26.6–33.0)
MCHC: 32.5 g/dL (ref 31.5–35.7)
MCV: 84 fL (ref 79–97)
Monocytes Absolute: 0.5 10*3/uL (ref 0.1–0.9)
Monocytes: 7 %
Neutrophils Absolute: 4.1 10*3/uL (ref 1.4–7.0)
Neutrophils: 59 %
Platelets: 324 10*3/uL (ref 150–450)
RBC: 4.99 x10E6/uL (ref 4.14–5.80)
RDW: 12.9 % (ref 11.6–15.4)
WBC: 7 10*3/uL (ref 3.4–10.8)

## 2023-03-09 LAB — COMPREHENSIVE METABOLIC PANEL
ALT: 18 IU/L (ref 0–44)
AST: 18 IU/L (ref 0–40)
Albumin: 4.4 g/dL (ref 4.1–5.1)
Alkaline Phosphatase: 59 IU/L (ref 44–121)
BUN/Creatinine Ratio: 15 (ref 9–20)
BUN: 13 mg/dL (ref 6–24)
Bilirubin Total: 0.4 mg/dL (ref 0.0–1.2)
CO2: 27 mmol/L (ref 20–29)
Calcium: 9.9 mg/dL (ref 8.7–10.2)
Chloride: 101 mmol/L (ref 96–106)
Creatinine, Ser: 0.87 mg/dL (ref 0.76–1.27)
Globulin, Total: 2.8 g/dL (ref 1.5–4.5)
Glucose: 92 mg/dL (ref 70–99)
Potassium: 4.8 mmol/L (ref 3.5–5.2)
Sodium: 141 mmol/L (ref 134–144)
Total Protein: 7.2 g/dL (ref 6.0–8.5)
eGFR: 109 mL/min/{1.73_m2} (ref >59)

## 2023-03-09 LAB — LIPID PANEL
Chol/HDL Ratio: 4.1 ratio (ref 0.0–5.0)
Cholesterol, Total: 162 mg/dL (ref 100–199)
HDL: 40 mg/dL (ref >39)
LDL Chol Calc (NIH): 110 mg/dL — ABNORMAL HIGH (ref 0–99)
Triglycerides: 61 mg/dL (ref 0–149)
VLDL Cholesterol Cal: 12 mg/dL (ref 5–40)

## 2023-03-09 LAB — HEMOGLOBIN A1C
Est. average glucose Bld gHb Est-mCnc: 105 mg/dL
Hgb A1c MFr Bld: 5.3 % (ref 4.8–5.6)

## 2024-03-09 ENCOUNTER — Ambulatory Visit
Admission: RE | Admit: 2024-03-09 | Discharge: 2024-03-09 | Disposition: A | Discharge status: Home / Self Care | Source: Ambulatory Visit | Attending: Internal Medicine | Admitting: Internal Medicine

## 2024-03-09 ENCOUNTER — Encounter: Payer: Self-pay | Admitting: Internal Medicine

## 2024-03-09 ENCOUNTER — Ambulatory Visit (INDEPENDENT_AMBULATORY_CARE_PROVIDER_SITE_OTHER): Payer: Self-pay | Admitting: Internal Medicine

## 2024-03-09 ENCOUNTER — Ambulatory Visit
Admission: RE | Admit: 2024-03-09 | Discharge: 2024-03-09 | Disposition: A | Discharge status: Home / Self Care | Attending: Internal Medicine | Admitting: Internal Medicine

## 2024-03-09 VITALS — BP 122/70 | HR 79 | Ht 70.0 in | Wt 266.0 lb

## 2024-03-09 DIAGNOSIS — Z131 Encounter for screening for diabetes mellitus: Secondary | ICD-10-CM

## 2024-03-09 DIAGNOSIS — Z1211 Encounter for screening for malignant neoplasm of colon: Secondary | ICD-10-CM | POA: Diagnosis not present

## 2024-03-09 DIAGNOSIS — Z0000 Encounter for general adult medical examination without abnormal findings: Secondary | ICD-10-CM | POA: Diagnosis not present

## 2024-03-09 DIAGNOSIS — E785 Hyperlipidemia, unspecified: Secondary | ICD-10-CM | POA: Diagnosis not present

## 2024-03-09 DIAGNOSIS — Z125 Encounter for screening for malignant neoplasm of prostate: Secondary | ICD-10-CM

## 2024-03-09 DIAGNOSIS — G8929 Other chronic pain: Secondary | ICD-10-CM

## 2024-03-09 DIAGNOSIS — M25552 Pain in left hip: Secondary | ICD-10-CM | POA: Diagnosis not present

## 2024-03-09 MED ORDER — TADALAFIL 10 MG PO TABS: 10.0000 mg | ORAL_TABLET | ORAL | 1 refills | Status: AC | PRN

## 2024-03-09 NOTE — Assessment & Plan Note (Signed)
 Managed with diet only. Lab Results  Component Value Date   LDLCALC 110 (H) 03/08/2023

## 2024-03-09 NOTE — Progress Notes (Signed)
 Date:  03/09/2024   Name:  Max Russell   DOB:  1981-04-05   MRN:  [id number]   Chief Complaint: Annual Exam Max Russell is a 43 y.o. male who presents today for his Complete Annual Exam. He feels well. He reports exercising - none. He reports he is sleeping fairly well.   Health Maintenance  Topic Date Due   Hepatitis B Vaccine (1 of 3 - 19+ 3-dose series) Never done   HPV Vaccine (1 - 3-dose SCDM series) Never done   COVID-19 Vaccine (4 - 2024-25 season) 04/28/2023   Colon Cancer Screening  Never done   Flu Shot  03/27/2024   DTaP/Tdap/Td vaccine (2 - Td or Tdap) 01/26/2029   Hepatitis C Screening  Completed   HIV Screening  Completed   Meningitis B Vaccine  Aged Out    Lab Results  Component Value Date   PSA1 1.7 03/02/2022    Hip Pain  There was no injury mechanism. The pain is present in the left hip. The quality of the pain is described as aching. The pain is mild. The pain has been Fluctuating since onset. Associated symptoms include a loss of motion (and clicking anteriorly). Pertinent negatives include no loss of sensation, muscle weakness or numbness. He reports no foreign bodies present.  Erectile Dysfunction This is a chronic problem. The problem is unchanged. The nature of his difficulty is maintaining erection. He reports no anxiety. Irritative symptoms do not include frequency or urgency. Pertinent negatives include no dysuria or hematuria. Past treatments include sildenafil  (caused a headache).    Review of Systems  Constitutional:  Negative for fatigue and unexpected weight change.  HENT:  Negative for nosebleeds.   Eyes:  Negative for visual disturbance.  Respiratory:  Negative for cough, chest tightness, shortness of breath and wheezing.   Cardiovascular:  Negative for chest pain, palpitations and leg swelling.  Gastrointestinal:  Negative for abdominal pain, constipation and diarrhea.  Genitourinary:  Negative for dysuria, frequency,  hematuria, penile pain and urgency.  Musculoskeletal:  Positive for arthralgias. Negative for joint swelling and myalgias.  Skin:  Negative for color change and rash.  Allergic/Immunologic: Negative for environmental allergies.  Neurological:  Negative for dizziness, weakness, light-headedness, numbness and headaches.  Psychiatric/Behavioral:  Negative for dysphoric mood and sleep disturbance. The patient is not nervous/anxious.      Lab Results  Component Value Date   NA 141 03/08/2023   K 4.8 03/08/2023   CO2 27 03/08/2023   GLUCOSE 92 03/08/2023   BUN 13 03/08/2023   CREATININE 0.87 03/08/2023   CALCIUM 9.9 03/08/2023   EGFR 109 03/08/2023   GFRNONAA 107 01/28/2020   Lab Results  Component Value Date   CHOL 162 03/08/2023   HDL 40 03/08/2023   LDLCALC 110 (H) 03/08/2023   TRIG 61 03/08/2023   CHOLHDL 4.1 03/08/2023   Lab Results  Component Value Date   TSH 1.090 03/02/2022   Lab Results  Component Value Date   HGBA1C 5.3 03/08/2023   Lab Results  Component Value Date   WBC 7.0 03/08/2023   HGB 13.6 03/08/2023   HCT 41.9 03/08/2023   MCV 84 03/08/2023   PLT 324 03/08/2023   Lab Results  Component Value Date   ALT 18 03/08/2023   AST 18 03/08/2023   ALKPHOS 59 03/08/2023   BILITOT 0.4 03/08/2023   No results found for: MARIEN BOLLS, VD25OH   Patient Active Problem List   Diagnosis Date Noted  Chronic hip pain, left 03/09/2024   Genital lesion, male 10/22/2022   Mild hyperlipidemia 01/27/2019   Microscopic hematuria 01/27/2019   Ulnar tunnel syndrome of left wrist 12/12/2016   Hx of dysplastic nevus 12/07/2015   Long term use of drug 12/07/2015   Abdominal lipoma 07/03/2015   Facet syndrome, lumbar 07/03/2015    No Known Allergies  Past Surgical History:  Procedure Laterality Date   dyplastic nevus     x 4    Social History   Tobacco Use   Smoking status: Never   Smokeless tobacco: Never  Substance Use Topics   Alcohol  use: Yes    Alcohol/week: 4.0 standard drinks of alcohol    Types: 4 Standard drinks or equivalent per week   Drug use: No     Medication list has been reviewed and updated.  Current Meds  Medication Sig   Citrulline POWD Take by mouth as needed.   Naproxen Sod-diphenhydrAMINE (ALEVE PM) 220-25 MG TABS Take by mouth.   naproxen sodium (ALEVE) 220 MG tablet Take 1 tablet by mouth 2 (two) times daily as needed.   nystatin -triamcinolone  ointment (MYCOLOG) Apply 1 Application topically 2 (two) times daily. PRN   Omega-3 Fatty Acids (FISH OIL) 1000 MG CAPS Take 1 capsule by mouth.   tadalafil  (CIALIS ) 10 MG tablet Take 1 tablet (10 mg total) by mouth every other day as needed for erectile dysfunction.       03/09/2024    8:29 AM 03/08/2023    7:59 AM 02/19/2022    1:21 PM 05/18/2021    8:09 AM  GAD 7 : Generalized Anxiety Score  Nervous, Anxious, on Edge 0 0 0 0  Control/stop worrying 0 0 0 0  Worry too much - different things 0 0 0 0  Trouble relaxing 0 0 0 0  Restless 0 0 0 0  Easily annoyed or irritable 0 0 0 0  Afraid - awful might happen 0 0 0 0  Total GAD 7 Score 0 0 0 0  Anxiety Difficulty Not difficult at all Not difficult at all Not difficult at all Not difficult at all       03/09/2024    8:29 AM 03/08/2023    7:59 AM 02/19/2022    1:20 PM  Depression screen PHQ 2/9  Decreased Interest 0 0 0  Down, Depressed, Hopeless 0 0 0  PHQ - 2 Score 0 0 0  Altered sleeping 0 0 0  Tired, decreased energy 0 0 0  Change in appetite 0 0 0  Feeling bad or failure about yourself  0 0 0  Trouble concentrating 0 0 0  Moving slowly or fidgety/restless 0 0 0  Suicidal thoughts 0 0 0  PHQ-9 Score 0 0 0  Difficult doing work/chores Not difficult at all Not difficult at all Not difficult at all    BP Readings from Last 3 Encounters:  03/09/24 122/70  03/08/23 106/76  10/22/22 128/82    Physical Exam Vitals and nursing note reviewed.  Constitutional:      Appearance: Normal  appearance. He is well-developed.  HENT:     Head: Normocephalic.     Right Ear: Tympanic membrane, ear canal and external ear normal.     Left Ear: Tympanic membrane, ear canal and external ear normal.     Nose: Nose normal.  Eyes:     Conjunctiva/sclera: Conjunctivae normal.     Pupils: Pupils are equal, round, and reactive to light.  Neck:  Thyroid: No thyromegaly.     Vascular: No carotid bruit.  Cardiovascular:     Rate and Rhythm: Normal rate and regular rhythm.     Heart sounds: Normal heart sounds.  Pulmonary:     Effort: Pulmonary effort is normal.     Breath sounds: Normal breath sounds. No wheezing.  Chest:  Breasts:    Right: No mass.     Left: No mass.  Abdominal:     General: Bowel sounds are normal.     Palpations: Abdomen is soft.     Tenderness: There is no abdominal tenderness.  Musculoskeletal:     Cervical back: Normal range of motion and neck supple.     Left hip: No tenderness or bony tenderness. Decreased range of motion.     Right lower leg: No edema.     Left lower leg: No edema.  Lymphadenopathy:     Cervical: No cervical adenopathy.  Skin:    General: Skin is warm and dry.     Capillary Refill: Capillary refill takes less than 2 seconds.  Neurological:     General: No focal deficit present.     Mental Status: He is alert and oriented to person, place, and time.     Deep Tendon Reflexes: Reflexes are normal and symmetric.  Psychiatric:        Attention and Perception: Attention normal.        Mood and Affect: Mood normal.        Thought Content: Thought content normal.     Wt Readings from Last 3 Encounters:  03/09/24 266 lb (120.7 kg)  03/08/23 259 lb (117.5 kg)  10/22/22 256 lb (116.1 kg)    BP 122/70   Pulse 79   Ht 5' 10 (1.778 m)   Wt 266 lb (120.7 kg)   SpO2 100%   BMI 38.17 kg/m   Assessment and Plan:  Problem List Items Addressed This Visit       Unprioritized   Mild hyperlipidemia (Chronic)   Managed with diet  only. Lab Results  Component Value Date   LDLCALC 110 (H) 03/08/2023         Relevant Medications   tadalafil  (CIALIS ) 10 MG tablet   Other Relevant Orders   Lipid panel   Chronic hip pain, left   Relevant Orders   DG Hip Unilat W OR W/O Pelvis 2-3 Views Left   Other Visit Diagnoses       Annual physical exam    -  Primary   Recommend regular exercise - 150 min per week walking, biking, swimming, etc due for CRC screen and PSA   Relevant Orders   CBC with Differential/Platelet   Comprehensive metabolic panel with GFR   Hemoglobin A1c   Lipid panel   PSA   TSH     Colon cancer screening       Relevant Orders   Ambulatory referral to Gastroenterology     Prostate cancer screening       Relevant Orders   PSA     Screening for diabetes mellitus       Relevant Orders   Comprehensive metabolic panel with GFR   Hemoglobin A1c       Return in about 1 year (around 03/09/2025) for CPX.    Leita HILARIO Adie, MD [facility] [place]

## 2024-03-10 ENCOUNTER — Ambulatory Visit: Payer: Self-pay | Admitting: Internal Medicine

## 2024-03-10 LAB — COMPREHENSIVE METABOLIC PANEL WITH GFR
ALT: 21 IU/L (ref 0–44)
AST: 21 IU/L (ref 0–40)
Albumin: 4.3 g/dL (ref 4.1–5.1)
Alkaline Phosphatase: 61 IU/L (ref 44–121)
BUN/Creatinine Ratio: 18 (ref 9–20)
BUN: 18 mg/dL (ref 6–24)
Bilirubin Total: 0.6 mg/dL (ref 0.0–1.2)
CO2: 25 mmol/L (ref 20–29)
Calcium: 9.6 mg/dL (ref 8.7–10.2)
Chloride: 99 mmol/L (ref 96–106)
Creatinine, Ser: 1.01 mg/dL (ref 0.76–1.27)
Globulin, Total: 3 g/dL (ref 1.5–4.5)
Glucose: 92 mg/dL (ref 70–99)
Potassium: 4.3 mmol/L (ref 3.5–5.2)
Sodium: 138 mmol/L (ref 134–144)
Total Protein: 7.3 g/dL (ref 6.0–8.5)
eGFR: 93 mL/min/1.73 (ref >59)

## 2024-03-10 LAB — LIPID PANEL
Chol/HDL Ratio: 4.7 ratio (ref 0.0–5.0)
Cholesterol, Total: 177 mg/dL (ref 100–199)
HDL: 38 mg/dL — ABNORMAL LOW (ref >39)
LDL Chol Calc (NIH): 127 mg/dL — ABNORMAL HIGH (ref 0–99)
Triglycerides: 65 mg/dL (ref 0–149)
VLDL Cholesterol Cal: 12 mg/dL (ref 5–40)

## 2024-03-10 LAB — CBC WITH DIFFERENTIAL/PLATELET
Basophils Absolute: 0.1 x10E3/uL (ref 0.0–0.2)
Basos: 1 %
EOS (ABSOLUTE): 0.2 x10E3/uL (ref 0.0–0.4)
Eos: 3 %
Hematocrit: 43.7 % (ref 37.5–51.0)
Hemoglobin: 14 g/dL (ref 13.0–17.7)
Immature Grans (Abs): 0 x10E3/uL (ref 0.0–0.1)
Immature Granulocytes: 0 %
Lymphocytes Absolute: 2.2 x10E3/uL (ref 0.7–3.1)
Lymphs: 28 %
MCH: 28.2 pg (ref 26.6–33.0)
MCHC: 32 g/dL (ref 31.5–35.7)
MCV: 88 fL (ref 79–97)
Monocytes Absolute: 0.6 x10E3/uL (ref 0.1–0.9)
Monocytes: 8 %
Neutrophils Absolute: 4.8 x10E3/uL (ref 1.4–7.0)
Neutrophils: 60 %
Platelets: 309 x10E3/uL (ref 150–450)
RBC: 4.96 x10E6/uL (ref 4.14–5.80)
RDW: 13.1 % (ref 11.6–15.4)
WBC: 7.9 x10E3/uL (ref 3.4–10.8)

## 2024-03-10 LAB — PSA: Prostate Specific Ag, Serum: 1.2 ng/mL (ref 0.0–4.0)

## 2024-03-10 LAB — HEMOGLOBIN A1C
Est. average glucose Bld gHb Est-mCnc: 105 mg/dL
Hgb A1c MFr Bld: 5.3 % (ref 4.8–5.6)

## 2024-03-10 LAB — TSH: TSH: 1.46 u[IU]/mL (ref 0.450–4.500)

## 2024-09-18 ENCOUNTER — Ambulatory Visit: Payer: Self-pay

## 2024-09-18 DIAGNOSIS — D123 Benign neoplasm of transverse colon: Secondary | ICD-10-CM | POA: Diagnosis not present

## 2024-09-18 DIAGNOSIS — Z1211 Encounter for screening for malignant neoplasm of colon: Secondary | ICD-10-CM | POA: Diagnosis present

## 2025-03-09 ENCOUNTER — Encounter: Admitting: Family Medicine
